# Patient Record
Sex: Male | Born: 1965 | Race: White | Hispanic: No | Marital: Single | Smoking: Current every day smoker
Health system: Southern US, Community
[De-identification: ages and names within clinical notes are randomized; demographics above are authoritative.]

## PROBLEM LIST (undated history)

## (undated) DIAGNOSIS — E7800 Pure hypercholesterolemia, unspecified: Secondary | ICD-10-CM

## (undated) DIAGNOSIS — I2510 Atherosclerotic heart disease of native coronary artery without angina pectoris: Secondary | ICD-10-CM

## (undated) DIAGNOSIS — I10 Essential (primary) hypertension: Secondary | ICD-10-CM

## (undated) DIAGNOSIS — F410 Panic disorder [episodic paroxysmal anxiety] without agoraphobia: Secondary | ICD-10-CM

## (undated) HISTORY — DX: Pure hypercholesterolemia, unspecified: E78.00

## (undated) HISTORY — DX: Atherosclerotic heart disease of native coronary artery without angina pectoris: I25.10

## (undated) HISTORY — DX: Essential (primary) hypertension: I10

## (undated) HISTORY — PX: CARDIAC CATHETERIZATION: SHX172

## (undated) HISTORY — DX: Panic disorder (episodic paroxysmal anxiety): F41.0

---

## 1993-08-24 HISTORY — PX: KNEE ARTHROSCOPY W/ MENISCAL REPAIR: SHX1877

## 1996-08-24 HISTORY — PX: ROTATOR CUFF REPAIR: SHX139

## 1998-04-21 ENCOUNTER — Emergency Department (HOSPITAL_COMMUNITY): Admission: EM | Admit: 1998-04-21 | Discharge: 1998-04-21 | Payer: Self-pay | Admitting: Emergency Medicine

## 1998-11-14 ENCOUNTER — Emergency Department (HOSPITAL_COMMUNITY): Admission: EM | Admit: 1998-11-14 | Discharge: 1998-11-14 | Payer: Self-pay | Admitting: Emergency Medicine

## 1999-07-11 ENCOUNTER — Encounter: Payer: Self-pay | Admitting: Emergency Medicine

## 1999-07-11 ENCOUNTER — Emergency Department (HOSPITAL_COMMUNITY): Admission: EM | Admit: 1999-07-11 | Discharge: 1999-07-11 | Payer: Self-pay | Admitting: Emergency Medicine

## 2000-05-01 ENCOUNTER — Emergency Department (HOSPITAL_COMMUNITY): Admission: EM | Admit: 2000-05-01 | Discharge: 2000-05-01 | Payer: Self-pay | Admitting: Emergency Medicine

## 2000-05-10 ENCOUNTER — Emergency Department (HOSPITAL_COMMUNITY): Admission: EM | Admit: 2000-05-10 | Discharge: 2000-05-10 | Payer: Self-pay | Admitting: Emergency Medicine

## 2000-08-25 ENCOUNTER — Emergency Department (HOSPITAL_COMMUNITY): Admission: EM | Admit: 2000-08-25 | Discharge: 2000-08-25 | Payer: Self-pay | Admitting: Emergency Medicine

## 2000-12-06 ENCOUNTER — Emergency Department (HOSPITAL_COMMUNITY): Admission: EM | Admit: 2000-12-06 | Discharge: 2000-12-06 | Payer: Self-pay | Admitting: Internal Medicine

## 2001-12-05 ENCOUNTER — Emergency Department (HOSPITAL_COMMUNITY): Admission: EM | Admit: 2001-12-05 | Discharge: 2001-12-05 | Payer: Self-pay | Admitting: Emergency Medicine

## 2001-12-06 ENCOUNTER — Emergency Department (HOSPITAL_COMMUNITY): Admission: EM | Admit: 2001-12-06 | Discharge: 2001-12-06 | Payer: Self-pay | Admitting: Emergency Medicine

## 2001-12-21 ENCOUNTER — Emergency Department (HOSPITAL_COMMUNITY): Admission: EM | Admit: 2001-12-21 | Discharge: 2001-12-22 | Payer: Self-pay | Admitting: Emergency Medicine

## 2001-12-23 ENCOUNTER — Emergency Department (HOSPITAL_COMMUNITY): Admission: EM | Admit: 2001-12-23 | Discharge: 2001-12-23 | Payer: Self-pay | Admitting: Emergency Medicine

## 2002-03-21 ENCOUNTER — Emergency Department (HOSPITAL_COMMUNITY): Admission: EM | Admit: 2002-03-21 | Discharge: 2002-03-21 | Payer: Self-pay | Admitting: Emergency Medicine

## 2003-02-20 ENCOUNTER — Emergency Department (HOSPITAL_COMMUNITY): Admission: EM | Admit: 2003-02-20 | Discharge: 2003-02-20 | Payer: Self-pay | Admitting: Emergency Medicine

## 2003-09-06 ENCOUNTER — Emergency Department (HOSPITAL_COMMUNITY): Admission: EM | Admit: 2003-09-06 | Discharge: 2003-09-06 | Payer: Self-pay | Admitting: Emergency Medicine

## 2004-06-30 ENCOUNTER — Emergency Department (HOSPITAL_COMMUNITY): Admission: EM | Admit: 2004-06-30 | Discharge: 2004-06-30 | Payer: Self-pay | Admitting: *Deleted

## 2004-10-28 ENCOUNTER — Ambulatory Visit: Payer: Self-pay | Admitting: Family Medicine

## 2005-07-02 ENCOUNTER — Emergency Department (HOSPITAL_COMMUNITY): Admission: EM | Admit: 2005-07-02 | Discharge: 2005-07-03 | Payer: Self-pay | Admitting: Emergency Medicine

## 2005-07-09 ENCOUNTER — Ambulatory Visit: Payer: Self-pay | Admitting: Family Medicine

## 2005-10-02 ENCOUNTER — Ambulatory Visit: Payer: Self-pay | Admitting: Family Medicine

## 2005-10-29 ENCOUNTER — Ambulatory Visit: Payer: Self-pay | Admitting: Family Medicine

## 2006-05-10 ENCOUNTER — Ambulatory Visit: Payer: Self-pay | Admitting: Family Medicine

## 2006-05-18 ENCOUNTER — Ambulatory Visit: Payer: Self-pay | Admitting: Family Medicine

## 2006-06-02 ENCOUNTER — Ambulatory Visit: Payer: Self-pay | Admitting: Pulmonary Disease

## 2006-07-13 ENCOUNTER — Encounter: Payer: Self-pay | Admitting: Pulmonary Disease

## 2006-07-13 ENCOUNTER — Ambulatory Visit (HOSPITAL_BASED_OUTPATIENT_CLINIC_OR_DEPARTMENT_OTHER): Admission: RE | Admit: 2006-07-13 | Discharge: 2006-07-13 | Payer: Self-pay | Admitting: Pulmonary Disease

## 2006-07-17 ENCOUNTER — Ambulatory Visit: Payer: Self-pay | Admitting: Pulmonary Disease

## 2006-08-03 ENCOUNTER — Ambulatory Visit: Payer: Self-pay | Admitting: Pulmonary Disease

## 2006-09-02 ENCOUNTER — Ambulatory Visit: Payer: Self-pay | Admitting: Pulmonary Disease

## 2006-10-08 ENCOUNTER — Ambulatory Visit: Payer: Self-pay | Admitting: Family Medicine

## 2007-05-04 DIAGNOSIS — F411 Generalized anxiety disorder: Secondary | ICD-10-CM

## 2007-05-04 DIAGNOSIS — J45909 Unspecified asthma, uncomplicated: Secondary | ICD-10-CM | POA: Insufficient documentation

## 2007-12-20 ENCOUNTER — Ambulatory Visit: Payer: Self-pay | Admitting: Family Medicine

## 2007-12-20 DIAGNOSIS — J309 Allergic rhinitis, unspecified: Secondary | ICD-10-CM | POA: Insufficient documentation

## 2007-12-24 ENCOUNTER — Observation Stay (HOSPITAL_COMMUNITY): Admission: EM | Admit: 2007-12-24 | Discharge: 2007-12-26 | Payer: Self-pay | Admitting: Emergency Medicine

## 2007-12-24 ENCOUNTER — Ambulatory Visit: Payer: Self-pay | Admitting: Internal Medicine

## 2007-12-30 ENCOUNTER — Ambulatory Visit: Payer: Self-pay | Admitting: Family Medicine

## 2007-12-30 DIAGNOSIS — N3000 Acute cystitis without hematuria: Secondary | ICD-10-CM

## 2007-12-30 DIAGNOSIS — I2510 Atherosclerotic heart disease of native coronary artery without angina pectoris: Secondary | ICD-10-CM | POA: Insufficient documentation

## 2007-12-30 LAB — CONVERTED CEMR LAB
Bilirubin Urine: NEGATIVE
Glucose, Urine, Semiquant: NEGATIVE
Specific Gravity, Urine: 1.015
pH: 6.5

## 2008-01-02 ENCOUNTER — Telehealth: Payer: Self-pay | Admitting: Family Medicine

## 2008-01-04 ENCOUNTER — Ambulatory Visit: Payer: Self-pay | Admitting: Family Medicine

## 2008-01-06 ENCOUNTER — Telehealth (INDEPENDENT_AMBULATORY_CARE_PROVIDER_SITE_OTHER): Payer: Self-pay

## 2008-01-06 LAB — CONVERTED CEMR LAB
Albumin: 3.8 g/dL (ref 3.5–5.2)
Alkaline Phosphatase: 95 units/L (ref 39–117)
BUN: 16 mg/dL (ref 6–23)
Basophils Relative: 0.2 % (ref 0.0–1.0)
Eosinophils Relative: 2 % (ref 0.0–5.0)
GFR calc Af Amer: 136 mL/min
Glucose, Bld: 120 mg/dL — ABNORMAL HIGH (ref 70–99)
Glucose, Urine, Semiquant: NEGATIVE
HCT: 43.7 % (ref 39.0–52.0)
Hemoglobin: 14.9 g/dL (ref 13.0–17.0)
Monocytes Absolute: 0.6 10*3/uL (ref 0.1–1.0)
Monocytes Relative: 6.9 % (ref 3.0–12.0)
Neutro Abs: 5.5 10*3/uL (ref 1.4–7.7)
Nitrite: NEGATIVE
Potassium: 3.7 meq/L (ref 3.5–5.1)
RBC: 4.88 M/uL (ref 4.22–5.81)
Specific Gravity, Urine: 1.015
Total CHOL/HDL Ratio: 4.4
Total Protein: 7 g/dL (ref 6.0–8.3)
WBC Urine, dipstick: NEGATIVE
WBC: 8.5 10*3/uL (ref 4.5–10.5)

## 2008-01-09 ENCOUNTER — Ambulatory Visit: Payer: Self-pay | Admitting: Family Medicine

## 2008-01-09 DIAGNOSIS — Z87448 Personal history of other diseases of urinary system: Secondary | ICD-10-CM

## 2008-01-09 LAB — CONVERTED CEMR LAB
Bilirubin Urine: NEGATIVE
Nitrite: NEGATIVE
Urobilinogen, UA: 0.2

## 2008-01-12 ENCOUNTER — Telehealth: Payer: Self-pay | Admitting: Family Medicine

## 2008-01-13 ENCOUNTER — Ambulatory Visit: Payer: Self-pay | Admitting: Family Medicine

## 2008-01-13 DIAGNOSIS — N200 Calculus of kidney: Secondary | ICD-10-CM | POA: Insufficient documentation

## 2008-01-13 LAB — CONVERTED CEMR LAB
Glucose, Urine, Semiquant: NEGATIVE
Nitrite: NEGATIVE
Protein, U semiquant: NEGATIVE
Urobilinogen, UA: 0.2
WBC Urine, dipstick: NEGATIVE

## 2008-02-08 ENCOUNTER — Ambulatory Visit: Payer: Self-pay | Admitting: Family Medicine

## 2008-02-08 ENCOUNTER — Ambulatory Visit: Payer: Self-pay | Admitting: Pulmonary Disease

## 2008-02-08 DIAGNOSIS — G4733 Obstructive sleep apnea (adult) (pediatric): Secondary | ICD-10-CM

## 2008-02-08 DIAGNOSIS — R002 Palpitations: Secondary | ICD-10-CM

## 2008-03-05 ENCOUNTER — Ambulatory Visit: Payer: Self-pay | Admitting: Family Medicine

## 2008-03-05 DIAGNOSIS — J019 Acute sinusitis, unspecified: Secondary | ICD-10-CM | POA: Insufficient documentation

## 2008-05-02 ENCOUNTER — Telehealth: Payer: Self-pay | Admitting: Family Medicine

## 2008-05-07 ENCOUNTER — Ambulatory Visit: Payer: Self-pay | Admitting: Family Medicine

## 2008-05-07 DIAGNOSIS — Z87442 Personal history of urinary calculi: Secondary | ICD-10-CM

## 2008-06-12 ENCOUNTER — Ambulatory Visit: Payer: Self-pay | Admitting: Family Medicine

## 2008-06-12 DIAGNOSIS — R040 Epistaxis: Secondary | ICD-10-CM

## 2008-06-12 DIAGNOSIS — R51 Headache: Secondary | ICD-10-CM

## 2008-07-24 ENCOUNTER — Ambulatory Visit: Payer: Self-pay | Admitting: Family Medicine

## 2008-07-25 ENCOUNTER — Telehealth (INDEPENDENT_AMBULATORY_CARE_PROVIDER_SITE_OTHER): Payer: Self-pay | Admitting: *Deleted

## 2008-07-26 ENCOUNTER — Ambulatory Visit: Payer: Self-pay | Admitting: Pulmonary Disease

## 2008-07-26 ENCOUNTER — Ambulatory Visit: Payer: Self-pay

## 2008-07-26 ENCOUNTER — Ambulatory Visit: Payer: Self-pay | Admitting: Cardiovascular Disease

## 2008-09-26 ENCOUNTER — Ambulatory Visit: Payer: Self-pay | Admitting: Family Medicine

## 2008-10-03 ENCOUNTER — Telehealth: Payer: Self-pay | Admitting: Family Medicine

## 2008-10-12 ENCOUNTER — Ambulatory Visit: Payer: Self-pay | Admitting: Pulmonary Disease

## 2008-11-17 ENCOUNTER — Emergency Department (HOSPITAL_COMMUNITY): Admission: EM | Admit: 2008-11-17 | Discharge: 2008-11-17 | Payer: Self-pay | Admitting: Emergency Medicine

## 2009-01-22 ENCOUNTER — Telehealth: Payer: Self-pay | Admitting: Family Medicine

## 2009-01-29 ENCOUNTER — Encounter: Payer: Self-pay | Admitting: Family Medicine

## 2009-03-18 ENCOUNTER — Ambulatory Visit: Payer: Self-pay | Admitting: Family Medicine

## 2009-03-18 DIAGNOSIS — T675XXA Heat exhaustion, unspecified, initial encounter: Secondary | ICD-10-CM | POA: Insufficient documentation

## 2009-05-01 ENCOUNTER — Ambulatory Visit: Payer: Self-pay | Admitting: Family Medicine

## 2009-05-01 ENCOUNTER — Ambulatory Visit: Payer: Self-pay | Admitting: Cardiology

## 2009-05-01 LAB — CONVERTED CEMR LAB
Nitrite: NEGATIVE
Urobilinogen, UA: 0.2
WBC Urine, dipstick: NEGATIVE

## 2009-05-07 ENCOUNTER — Ambulatory Visit: Payer: Self-pay | Admitting: Family Medicine

## 2009-05-07 LAB — CONVERTED CEMR LAB
Ketones, urine, test strip: NEGATIVE
Nitrite: NEGATIVE
Urobilinogen, UA: 0.2

## 2009-07-03 ENCOUNTER — Encounter (INDEPENDENT_AMBULATORY_CARE_PROVIDER_SITE_OTHER): Payer: Self-pay | Admitting: *Deleted

## 2010-05-06 ENCOUNTER — Emergency Department (HOSPITAL_COMMUNITY)
Admission: EM | Admit: 2010-05-06 | Discharge: 2010-05-06 | Payer: Self-pay | Source: Home / Self Care | Admitting: Emergency Medicine

## 2010-05-08 ENCOUNTER — Telehealth: Payer: Self-pay | Admitting: Family Medicine

## 2010-09-23 NOTE — Progress Notes (Signed)
Summary: friday late ov  Phone Note Call from Patient Call back at [phone]   Caller: Patient Call For: Nelwyn Salisbury MD Summary of Call: pt had abcess remove from thigh at [facility] needs fu this friday late can I sch? Initial call taken by: Heron Sabins,  May 08, 2010 10:30 AM  Follow-up for Phone Call        Pt called back to check on status of getting late ov sch for this friday. Pls call asap.  Follow-up by: Lucy Antigua,  May 08, 2010 11:30 AM  Additional Follow-up for Phone Call Additional follow up Details #1::        yes go ahead and schedule this  Additional Follow-up by: Nelwyn Salisbury MD,  May 08, 2010 12:21 PM    Additional Follow-up for Phone Call Additional follow up Details #2::    pt stated he will callback Follow-up by: Heron Sabins,  May 08, 2010 1:06 PM  Additional Follow-up for Phone Call Additional follow up Details #3:: Details for Additional Follow-up Action Taken: as of 05-20-2010 pt did not cb Additional Follow-up by: Heron Sabins,  May 20, 2010 3:02 PM

## 2011-01-06 NOTE — Discharge Summary (Signed)
Dylan Houston, MURNANE NO.:  [id number]   MEDICAL RECORD NO.:  [id number]          PATIENT TYPE:  INP   LOCATION:  3738                         FACILITY:  [facility]   PHYSICIAN:  Veverly Fells. Excell Seltzer, MD  DATE OF BIRTH:  1965/11/30   DATE OF ADMISSION:  12/24/2007  DATE OF DISCHARGE:  12/26/2007                               DISCHARGE SUMMARY   PRIMARY CARE Monque Haggar:  Jeannett Senior A. Clent Ridges, MD.   PRIMARY PULMONOLOGIST:  Barbaraann Share, MD, FCCP.   DISCHARGE DIAGNOSIS:  Chest pain.   SECONDARY DIAGNOSES:  1. 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  2. Ongoing tobacco abuse.  3. Asthma.  4. Allergic rhinitis.  5. Nephrolithiasis.   ALLERGIES:  He is intolerant to Benadryl.   PROCEDURES:  Left side cardiac catheterization.   HISTORY OF PRESENT ILLNESS:  A 45 year old Caucasian male with several  month history of left-sided chest discomfort, described as a punching  sensation, occurring intermittently.  On the day of admission, he was at  work and when he started to smoke a cigarette, he started to feel dizzy  with chest pressure and when he walked back inside, he apparently did  not look well, and the EMS was activated by coworkers.  Upon arrival at  [facility] Emergency Department, cardiac point of care markers were  negative x3, and an ECG showed sinus tachycardia with no acute ST-T wave  changes.  Decision was made to admit the patient for further evaluation  and cardiac catheterization.   HOSPITAL COURSE:  The patient ruled out for MI and was scheduled for  cardiac catheterization.  Catheterization performed today showed  nonobstructive coronary artery disease with normal LV function.  Postprocedure, the patient has been ambulating without recurrent  discomfort and will be discharged home today in good condition.   DISCHARGE LABORATORY:  Hemoglobin 14.2, hematocrit 41.5, WBC 9.1,  platelets 23, and MCV 88.0.   Sodium 140, potassium 4.0, chloride 104,  CO2 26, BUN 13, creatinine 29, glucose 101, total bilirubin 0.5,  alkaline phosphatase 97, AST 22, ALT 23, and albumin 3.5.  Cardiac  markers negative x3.  Total cholesterol 158, triglycerides 209, HDL 29,  LDL 87, calcium 8.8, hemoglobin A1c 5.2, and TSH 1.359.   DISPOSITION:  The patient is being discharged home today in good  condition.   FOLLOWUP APPOINTMENTS:  He was asked to follow up with Dr. Clent Ridges in 2-3  weeks.   DISCHARGE MEDICATIONS:  1. Aspirin 81 mg daily.  2. Simvastatin 40 mg nightly.  3. Klonopin 2 mg t.i.d. p.r.n.   OUTSTANDING LAB STUDIES:  None.   The patient has been counseled the importance of smoking cessation.   DURATION OF DISCHARGE/ENCOUNTER:  Forty minutes including physician  time.      Nicolasa Ducking, ANP      Veverly Fells. Excell Seltzer, MD  Electronically Signed    CB/MEDQ  D:  12/26/2007  T:  12/27/2007  Job:  [id number]   cc:   Tera Mater. Clent Ridges, MD

## 2011-01-06 NOTE — Assessment & Plan Note (Signed)
[facility]                            CARDIOLOGY OFFICE NOTE   NAME:Dylan Houston, Dylan Houston                       MRN:          [id number]  DATE:07/26/2008                            DOB:          January 24, 1966    Dylan Houston is a 42-year patient referred by Dr. Clent Ridges.  I have seen him in  the past.  The patient was referred for palpitations and tachycardia.   Unfortunately, Dylan Houston is a very heavy smoker.  He admits to at least 1  pack a day; however, he frequently wakes up in the middle of the night  to have a cigarette.   He has had previous chest pain and heart catheterization by Dr. Excell Seltzer  back on Dec 26, 2007.  He had no critical coronary disease, normal  filling pressures, known EF of 60%.   The patient indicates that he has always had a relatively high heart  rate.  Looking back through our records, he had a pulse of 90 back in  November 2008 and pulse of 110 back in December 2007.   He has significant sleep apnea.  He has lost little bit of weight lately  and his CPAP mask is not working fine.  He is to see Dr. Shelle Iron today.   The patient otherwise does not have a lot of stimulants.   His degree of COPD has not been characterized.   I suspect that his relative tachycardia is related to nighttime  hypoxemia, central obesity, nicotine, and high catecholamine anxiety-  type state.   The patient does not actively wheeze.  He does not have documented  bronchospasm by PFTs.   In regards to his heart, he has not had any significant chest pain.  His  palpitations have been fairly frequent.  He does indicate that he can  have sudden onset of palpitations that last a few minutes and then seem  to come back down.  There is no associated presyncope or diaphoresis.  There is associated shortness of breath which he can also get without  palpitations.   His review of systems is otherwise negative.   He has past medical history remarkable for anxiety, mild  coronary  disease by cath in May 2009, allergic rhinitis, nephrolithiasis.  He has  had previous left knee surgery and rotator cuff tear.   The patient is divorced.  He has 2 children.  He works doing Airline pilot for  [employer].  He smokes at least a pack a day and denies alcohol intake or  other drugs.   Family history is remarkable for premature coronary disease on both  mother's and father's side.  His father is dying of lung cancer.   His current medications only include an occasional aspirin.  He  previously had been on simvastatin, I am not sure why this was stopped.  He does have hypercholesterolemia.   PHYSICAL EXAMINATION:  GENERAL:  Remarkable for a disheveled male who  looks older than his stated age.  He has multiple tattoos over his body.  VITAL SIGNS:  Weight is 193, blood pressure is 149/79, pulse is  105 and  regular, respiratory rate 14 and afebrile.  HEENT:  Unremarkable.  Carotids normal without bruit.  No  lymphadenopathy, thyromegaly, JVP elevation.  LUNGS:  Clear with no active wheezing.  S1 and S2 with normal heart  sounds.  PMI normal.  ABDOMEN:  Benign.  Bowel sounds positive.  No AAA.  No bruit.  No  hepatosplenomegaly or hepatojugular reflux.  No tenderness.  EXTREMITIES:  Distal pulses are intact.  No edema.  NEUROLOGIC:  Nonfocal.  SKIN:  Warm and dry.  MUSCULOSKELETAL:  No muscular weakness.   EKG is normal except for tachycardia.  P-wave morphology is normal,  upright in II, III, and F.   IMPRESSION:  1. Relative tachycardia.  I suspect this is benign.  He has had this      for quite some time.  We have documentation of it as far back as      2007.  He has had normal cath with good left ventricular function      and normal filling pressures.  He had a TSH checked in May which      was normal for the time being; however, we will start him on Toprol      50 a day.  We will give him an event monitor to see if there is any      other supraventricular rhythm  is going on.  I will then see him      back to reassess the situation.  2. History of sleep apnea with poorly fitting continuous positive      airway pressure mask, question nocturnal hypoxemia contributing to      tachycardia.  Follow up with Dr. Shelle Iron today.  3. Hypercholesterolemia.  Script for simvastatin 40 mg a day redone      through Dr. Clinton Gallant.  4. Smoking cessation.  Spent greater than 10 minutes talking with the      patient.  I think he would be a reasonable candidate for Chantix.      I encouraged him to follow up with Dr. Clent Ridges to get this      prescription.  We are starting him back on simvastatin and Toprol,      and I think it would be better for him to possibly start his      Chantix in about a month.  I also encouraged him to start taking a      baby aspirin a day.  He has had some minor nosebleeds on adult      aspirin.  I think he will do fine with a baby aspirin.  5. History of reflux, p.r.n. Nexium.   I will see him back in 4-6 weeks to further assess his heart rate.  At  some point in time, it may be worthwhile to get a 24-hour urine  collection on him to check his catecholamine levels as well.    Noralyn Pick. Eden Emms, MD, [facility]  Electronically Signed   PCN/MedQ  DD: 07/26/2008  DT: 07/27/2008  Job #: [id number]   cc:   Jeannett Senior A. Clent Ridges, MD

## 2011-01-06 NOTE — Cardiovascular Report (Signed)
NAMEHILMAN, KISSLING NO.:  [id number]   MEDICAL RECORD NO.:  [id number]          PATIENT TYPE:  INP   LOCATION:  3738                         FACILITY:  [facility]   PHYSICIAN:  Veverly Fells. Excell Seltzer, MD  DATE OF BIRTH:  [birth year]   DATE OF PROCEDURE:  DATE OF DISCHARGE:                            CARDIAC CATHETERIZATION   PROCEDURES:  1. Left heart catheterization.  2. Selective coronary angiography.  3. Left ventricular angiography, and StarClose of the right femoral      artery.   INDICATIONS:  Mr. Mccreedy is a 45 year old gentleman who presented with  chest pain.  He had typical exertional symptoms with a high pretest  probability for CAD in the setting of multiple cardiac risk factors.  He  was referred directly for cardiac cath.   Risks and indications of procedure were reviewed with the patient.  Informed consent was obtained.  The right groin was prepped and draped  and anesthetized with 1% lidocaine.  Using modified Seldinger technique,  a 6-French sheath was placed in the right femoral artery.  A standard 6-  [place] Judkins catheters were used for coronary angiography and left  ventriculography.  A pullback across the aortic valve was done.  At the  completion of the procedure, a StarClose device was used for hemostasis.   FINDINGS:  Aortic pressure 98/71 with a mean of 84, left ventricular  pressure 99/15.   Coronary angiography.  Left Mainstem:  The left main is widely patent.  There are no significant stenoses.  It bifurcates into the LAD and left  circumflex.   LAD:  The LAD is moderate-sized vessel proximally.  There is a 30%  stenosis just before the first septal perforator.  The vessel supplies a  large first diagonal branch that essentially runs in the course of a  twin LAD.  The diagonal branch is actually a larger caliber vessel than  the LAD proper.  The diagonal has minimal plaque without significant  stenosis.  The LAD has mild-to-moderate  diffuse stenosis of no worse  than 40%.   The left circumflex is widely patent.  There is mild plaque with 30%  stenosis.  In the proximal circumflex, there is a large first OM branch.  The AV groove courses down and supplies two small posterolateral  branches.   Right coronary artery:  The right coronary artery is widely patent.  There are no significant stenoses throughout.  The right coronary artery  divides into a PDA and posterolateral branch distally, both of which are  large vessels.   Left ventriculography is normal.  The LVEF is 60%.   ASSESSMENT:  1. Nonobstructive coronary artery disease.  2. Normal left ventricular function.   PLAN:  I recommend medical therapy with aspirin and a statin for goal of  LDL less than 100.  Tobacco cessation was strongly advised.      Veverly Fells. Excell Seltzer, MD  Electronically Signed     MDC/MEDQ  D:  12/26/2007  T:  12/27/2007  Job:  [id number]   cc:   Tera Mater. Clent Ridges, MD

## 2011-01-06 NOTE — Assessment & Plan Note (Signed)
[facility]                                 ON-CALL NOTE   NAME:FARMERDamani, Kelemen                         MRN:          [id number]  DATE:01/01/2008                            DOB:          [birth year]    DATE OF INTERACTION:  Jan 01, 2008, at 10:22 a.m.  Phone number is [phone].   OBJECTIVE:  The patient  is being treated for a bacterial infection  either of the stomach or urine; I could not tell which. Has had cramps  and blood in the urine.  He is on Cipro since earlier in the week.  Yesterday his bleeding in his urine cleared, and now he is having blood  in the urine with cramping and is concerned about it.   OBJECTIVE:  Presumably bladder bleeding, possibly urinary tract  infection, possibly urethritis. stomach cramps.   PLAN:  I told him that Azo, an over-the-counter medicine, might help the  cramping. The bleeding is not terribly concerning at this stage as long  as it clears again, and told him to drink liquids as much as possible  today. Just in case the cramping is in the stomach, would have him take  clear liquids today to give the bowels some rest, and, if symptoms  worsen, go to the emergency room or to acute care. Also, I think the  patient ought to be seen soon and told him to call in the morning to get  an appointment to see Dr. Clent Ridges for a recheck.  Primary care Daulton Harbaugh is  Dr. Clent Ridges, home office is [place].     Arta Silence, MD  Electronically Signed    RNS/MedQ  DD: 01/01/2008  DT: 01/01/2008  Job #: [id number]

## 2011-01-06 NOTE — H&P (Signed)
[name], FILES NO.:  [id number]   MEDICAL RECORD NO.:  [id number]          PATIENT TYPE:  INP   LOCATION:  3738                         FACILITY:  [facility]   PHYSICIAN:  Joellyn Rued, PA-C     DATE OF BIRTH:  [birth year]   DATE OF ADMISSION:  12/24/2007  DATE OF DISCHARGE:                              HISTORY & PHYSICAL   PRIMARY CARE PHYSICIAN:  Jeannett Senior A. Clent Ridges, MD.   ADMITTING PHYSICIAN:  Bevelyn Buckles. Bensimhon, MD.   PULMONOLOGIST:  Barbaraann Share, MD,FCCP.   HISTORY:  Dylan Houston is a 45 year old white male who is transferred via  EMS to [facility] for evaluation of chest discomfort.  Mr.  Erbes describes a several-month history of left-sided anterior chest  kind of punching sensation.  This is usually associated with increased  heart rate, but he never checked his pulse.  He also noticed shortness  of breath and dizziness, but denies any actual syncope.  He rarely has  nausea and occasional diaphoresis.  It usually last approximately 15  minutes, it is difficult to pin point frequency, it depends on his  activity.  He states it usually occurs with lifting not specifically  with exertion or nocturnally.   Today, while at work, he went outside to smoke a cigarette and then  decided not to.  He stated that he felt very dizzy and felt a pressure  on the left side of his chest.  When he walked back inside to work, his  supervisor and co-workers looked at him, and sat his down, stating that  he looked off, and they he called 911.  EMS report is not available.  Here in the emergency room at this time, he is pain free.   He also has hypokalemia.   PAST MEDICAL HISTORY:   ALLERGIES:  No known drug allergies.  However, he states he has BENADRYL  INTOLERANCE.   He takes Klonopin 2 mg up to 3 times a day.   He has history of severe obstructive sleep apnea by nocturnal  polysomnogram on 07/13/2006.  He states he has compliant for CPAP.  He  has  history of asthma, allergic rhinitis, and kidney stones.  He states  that he has been told by Dr. Clent Ridges that his resting heart rate is fast,  and he has been told in the past that he is hard to wake up from  surgery.  He admits to left shoulder and left knee surgery.  He  specifically denies diabetes, hypertension, hyperlipidemia, mycoardial  infarction, CVA, COPD, or bleeding dyscrasias.   SOCIAL HISTORY:  He resides in [place] with his wife.  He has 3  children and 1 grandchild.  He is a Occupational psychologist.  He  smokes up to 1 pack per day for 30+ years.  Denies any alcohol, drugs,  or herbal medications.  Does not watch his diet.  Does not exercise.   FAMILY HISTORY:  His mother died of cancer.  His father is alive at age  23, had bypass surgery in his 76, and he is now  being treated for lung  cancer.  He has 2 brothers, age 81 and 60, who are alive and well.   REVIEW OF SYSTEMS:  In addition to the above, he is notable for  headache, sinus congestion, glasses, poor dentition, multiple tattoos,  chronic dyspnea on exertion, nocturia, occasional anxiety, arthralgias  in his back, neck, shoulders, knees, elbows and rest all other points  are unremarkable.  He denies syncope, claudication, or wheezing.   PHYSICAL EXAMINATION:  GENERAL:  Well-nourished, well-developed white  male who appears older than stated age.  Wife is present.  VITAL SIGNS:  Temperature 97.8, initial blood pressure is 116/62, pulse  106, respirations 18, and 98% sat on room air.  HEENT:  Unremarkable except for poor dentition.  NECK:  Supple without thyromegaly, adenopathy, JVD, or carotid bruits.  CHEST:  Symmetrical excursion.  Breath sounds are diminished but clear  to auscultation.  HEART:  PMI is nondisplaced.  Regular rate and rhythm.  I do not  appreciate any S3 and S4 murmurs, rubs, clicks, or gallops.  All pulses  are symmetrical and intact without any abdominal or femoral bruits.   SKIN/INTEGUMENT:  Intact.  He has multiple tattoos.  ABDOMEN:  Obese.  Bowel sounds present without organomegaly, masses, or  tenderness.  EXTREMITIES:  No cyanosis, clubbing, or edema.  MUSCULOSKELETAL:  Unremarkable.  NEURO:  Unremarkable.   Chest x-ray showed no active disease.  EKG showed sinus tachycardia with  a rate of 108, normal axis, nonspecific changes, normal intervals.  No  old EKG for comparison.  H and H was 14.6 and 41.3, normal indices,  platelets 230, and WBC is 9.1.  Sodium 139, potassium 3.2, BUN 8,  creatinine 0.83, and glucose 120.  Point-of-care markers negative x3.   IMPRESSION AND PLAN:  Dr. Gala Romney reviewed the patients' history,  spoke with and examined the patient, and agrees with the above.  Dr.  Gala Romney feels these symptoms are classic for unstable angina.  We will  admit him.  Begin heparin, beta blocker, aspirin, statin, and nitrates.  Anticipate cardiac catheterization on Monday for further evaluation.  We  have already counseled the patient on tobacco cessation and weight loss.      Joellyn Rued, PA-C     EW/MEDQ  D:  12/24/2007  T:  12/24/2007  Job:  [id number]   cc:   Barbaraann Share, MD,FCCP

## 2011-01-09 NOTE — Procedures (Signed)
NAME:  Dylan Houston, Dylan Houston NO.:  [id number]   MEDICAL RECORD NO.:  [id number]          PATIENT TYPE:  OUT   LOCATION:  SLEEP CENTER                 [facility]  [facility]   PHYSICIAN:  Barbaraann Share, MD,FCCPDATE OF BIRTH:  Dec 28[birth year] 1965   DATE OF STUDY:  07/13/2006                            NOCTURNAL POLYSOMNOGRAM   REFERRING PHYSICIAN:  Dr. Marcelyn Bruins   INDICATION FOR STUDY:  Hypersomnia with sleep apnea.   EPWORTH SCORE:  11.   SLEEP ARCHITECTURE:  The patient had total sleep time 342 minutes with  decreased REM and never achieved slow wave sleep.  Sleep onset latency  was prolonged at 40 minutes.  REM onset was very prolonged at 237  minutes.  Sleep efficiency was decreased at 81%.   RESPIRATORY DATA:  The patient was found to have 32 hypotonias and 390  obstructive apneas for a respiratory disturbance index of 74 events per  hour.  The events were not positional but there was severe snoring noted  throughout.   OXYGEN DATA:  His oxygen saturation was as low as 60% during the  patient's obstructive events.   CARDIAC DATA:  No clinically significant cardiac arrhythmia.   MOVEMENT/PARASOMNIA:  None.   IMPRESSION/RECOMMENDATIONS:  Severe obstructive sleep apnea/hypotonia  syndrome with a respiratory disturbance index of 74 events per hour and  oxygen desaturation as low as 60%.  Treatment for this degree of sleep  apnea should focus primarily on weight loss if applicable as well as  CPAP.      Barbaraann Share, MD,FCCP  Diplomate, American Board of Sleep  Medicine     [facility]/MEDQ  D:  07/21/2006 10:46:27  T:  07/21/2006 14:33:57  Job:  [id number]

## 2011-01-09 NOTE — Assessment & Plan Note (Signed)
[place] [facility]                               PULMONARY OFFICE NOTE   NAME:Dylan Houston, Dylan Houston                       MRN:          [id number]  DATE:06/02/2006                            DOB:          07/30/1966    HISTORY OF PRESENT ILLNESS:  The patient is a 45 year old white male whom I  have been asked to see for possible sleep apnea.  The patient states that he  has had heavy snoring, as well as pauses in his breathing during sleep for  at least the last 7 to 8 years.  He typically goes to bed between 10:00 and  12:00 and gets up at 6:30 to 7:00 to start his day.  He has been noted to  have frequent awakenings during the night and does not feel rested the  following morning.  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  He has  no difficulties with driving.  Of note, his weight is up about 5 or 6 pounds  over the last 2 years.   PAST MEDICAL HISTORY:  1. Significant for asthma.  2. History of allergic rhinitis.  3. History of prior shoulder and knee surgery.   CURRENT MEDICATIONS:  1. Zegerid 40 mg nightly.  2. Klonopin 0.5 mg p.r.n.  3. Vicodin 5/500 p.r.n.   The patient is intolerant to BENADRYL.   SOCIAL HISTORY:  He is separated and has children.  He has a history of  smoking 1 to 1-1/2 of cigarettes per day for the last 26 years.  He  continues to smoke.   FAMILY HISTORY:  Remarkable for his father having emphysema and lung cancer.  His mother had breast cancer.   REVIEW OF SYSTEMS:  As per history of present illness.  Also see patient  intake form documented in the chart.   PHYSICAL EXAM:  GENERAL:  He is an overweight male in no acute distress.  Blood pressure 114/76, pulse 116, temperature 98.5, weight is 208 pounds.  O2 saturation on room air is 96%.  HEENT:  Pupils are equal, round, and reactive to light and accommodation.  Extraocular muscles are intact.   Nares are somewhat narrowed bilaterally.  Oropharynx does show significant elongation of the soft palate and uvula.  The patient has a small lower jaw with overbite.  NECK:  Supple without JVD or lymphadenopathy.  No palpable thyromegaly.  CHEST:  Totally clear, except for a mild decrease in breath sounds.  CARDIAC:  Regular rate and rhythm.  ABDOMEN:  Soft and nontender with good bowel sounds.  GENITAL, RECTAL, AND BREASTS:  Not done and not indicated.  LOWER EXTREMITIES:  Without edema.  Good pulses distally.  No calf  tenderness.  NEUROLOGIC:  He is alert and oriented with no obvious motor deficits.   IMPRESSION:  Probable obstructive sleep apnea.  The patient gives a classic  history of this.  He is overweight and has very abnormal upper airway  anatomy.  I had a long discussion with him  about sleep apnea including the  short-term quality of life issues and the long-term cardiovascular issues.  At this point in time he will need nocturnal polysomnography for diagnosis.   PLAN:  1. Schedule for nocturnal polysomnogram.  2. Work on weight loss.  3. The patient will follow up after the above.            ______________________________  Barbaraann Share, MD,FCCP      [facility]/MedQ  DD:  06/14/2006  DT:  06/14/2006  Job #:  [id number]   cc:   Jeannett Senior A. Clent Ridges, MD

## 2012-05-18 ENCOUNTER — Telehealth: Payer: Self-pay | Admitting: Family Medicine

## 2012-05-18 NOTE — Telephone Encounter (Signed)
Schedule pt for a 30 minute office visit and advise him to come fasting for lab work.

## 2012-05-18 NOTE — Telephone Encounter (Signed)
Pt req to re-est. Last seen 05/07/2009. Pt has head congestion. Is ok to re-est or ok to just due acute ov?

## 2012-05-19 NOTE — Telephone Encounter (Signed)
Pt called and has been sched for 30 min office on 05/26/12 at 10am fasting,as noted.

## 2012-05-26 ENCOUNTER — Ambulatory Visit: Payer: Self-pay | Admitting: Family Medicine

## 2012-06-30 ENCOUNTER — Ambulatory Visit (INDEPENDENT_AMBULATORY_CARE_PROVIDER_SITE_OTHER): Payer: Self-pay | Admitting: Family Medicine

## 2012-06-30 ENCOUNTER — Encounter: Payer: Self-pay | Admitting: Family Medicine

## 2012-06-30 VITALS — BP 112/70 | HR 111 | Temp 98.3°F | Wt 214.0 lb

## 2012-06-30 DIAGNOSIS — I2510 Atherosclerotic heart disease of native coronary artery without angina pectoris: Secondary | ICD-10-CM

## 2012-06-30 DIAGNOSIS — J45909 Unspecified asthma, uncomplicated: Secondary | ICD-10-CM

## 2012-06-30 DIAGNOSIS — R002 Palpitations: Secondary | ICD-10-CM

## 2012-06-30 DIAGNOSIS — Z23 Encounter for immunization: Secondary | ICD-10-CM

## 2012-06-30 DIAGNOSIS — F411 Generalized anxiety disorder: Secondary | ICD-10-CM

## 2012-06-30 MED ORDER — KETOCONAZOLE 2 % EX CREA: TOPICAL_CREAM | Freq: Three times a day (TID) | CUTANEOUS | Status: DC

## 2012-06-30 MED ORDER — PROPRANOLOL HCL 10 MG PO TABS: 10.0000 mg | ORAL_TABLET | Freq: Four times a day (QID) | ORAL | Status: DC | PRN

## 2012-06-30 MED ORDER — CLONAZEPAM 0.5 MG PO TABS: 0.5000 mg | ORAL_TABLET | Freq: Two times a day (BID) | ORAL | Status: DC | PRN

## 2012-06-30 MED ORDER — ATENOLOL 25 MG PO TABS: 25.0000 mg | ORAL_TABLET | Freq: Every day | ORAL | Status: DC

## 2012-06-30 NOTE — Addendum Note (Signed)
Addended by: Aniceto Boss A on: 06/30/2012 12:51 PM   Modules accepted: Orders

## 2012-06-30 NOTE — Progress Notes (Signed)
  Subjective:    Patient ID: Dylan Houston, male    DOB: 01/15/1966, 46 y.o.   MRN: [id number]  HPI 46 yr old male to re-establish after an absence of 3 years. He lost his job shortly after I saw him in September 2010, and he has been out of work ever since. Of course this means he has had no insurance either, so he has not seen any physicians until today. He had been on Tenormin and Propranolol for his rapid heart rates, and they had been working well. Now that he is out, he often experiences spells of rapid heart beats or fluttering which are not related to exertion. No chest pain or SOB. He also asks about an itchy rash in both groin areas that comes and goes for the past 2 years.   Review of Systems  Constitutional: Negative.   Respiratory: Negative.   Cardiovascular: Positive for palpitations. Negative for chest pain and leg swelling.  Neurological: Negative.        Objective:   Physical Exam  Constitutional: He is oriented to person, place, and time. He appears well-developed and well-nourished.  Neck: No thyromegaly present.  Cardiovascular: Normal rate, regular rhythm, normal heart sounds and intact distal pulses.   Pulmonary/Chest: Effort normal and breath sounds normal. No respiratory distress. He has no wheezes. He has no rales.  Lymphadenopathy:    He has no cervical adenopathy.  Neurological: He is alert and oriented to person, place, and time.  Psychiatric: He has a normal mood and affect. His behavior is normal. Thought content normal.          Assessment & Plan:  He seems to be stable and not much changed from the last time we saw him. Refilled his meds to take Atenolol daily and use Propranolol as needed. Try Ketoconazole for the Candidiasis.

## 2012-10-28 ENCOUNTER — Ambulatory Visit: Payer: Self-pay | Admitting: Family Medicine

## 2012-11-16 ENCOUNTER — Ambulatory Visit (HOSPITAL_BASED_OUTPATIENT_CLINIC_OR_DEPARTMENT_OTHER): Payer: Self-pay | Attending: Pulmonary Disease | Admitting: Radiology

## 2012-11-16 ENCOUNTER — Encounter: Payer: Self-pay | Admitting: Pulmonary Disease

## 2012-11-16 ENCOUNTER — Ambulatory Visit (INDEPENDENT_AMBULATORY_CARE_PROVIDER_SITE_OTHER): Payer: Self-pay | Admitting: Pulmonary Disease

## 2012-11-16 VITALS — BP 118/68 | HR 85 | Temp 97.6°F | Ht 64.0 in | Wt 216.8 lb

## 2012-11-16 DIAGNOSIS — G4733 Obstructive sleep apnea (adult) (pediatric): Secondary | ICD-10-CM

## 2012-11-16 DIAGNOSIS — Z9989 Dependence on other enabling machines and devices: Secondary | ICD-10-CM

## 2012-11-16 NOTE — Patient Instructions (Addendum)
Stay on cpap Will refer you to the sleep center for a mask fitting. Will give you a prescription for a heated humidifier, and can check online to see if cheaper ([facility] or [facility]) Work on weight loss followup with me in one year if doing well.

## 2012-11-16 NOTE — Assessment & Plan Note (Signed)
The patient is having issues with his CPAP because of a nonfunctioning mask.  He has no insurance, and is unable to afford a new mask.  Will refer him to the sleep center for a mask fitting, and perhaps they will be able to give him one.  I have also given him a prescription for a heated humidifier so he can try and purchase on line where it may be cheaper.  I have also encouraged him to work on weight loss

## 2012-11-16 NOTE — Progress Notes (Signed)
  Subjective:    Patient ID: Dylan Houston, male    DOB: Sep 26, 1965, 47 y.o.   MRN: [id number]  HPI The patient comes in today for followup of his obstructive sleep apnea.  He has not been seen in 4 years, but has stayed compliant on his CPAP.  His current mask is falling apart, and he is trying to hold it together with glue and tape.  He has also lost his heating humidifier.  He was doing well with CPAP prior to having mask issues, and now he is having frequent awakenings again with daytime sleepiness.  He has a history of very severe sleep apnea.  Of note, his weight is up 6 pounds since last visit.   Review of Systems  Constitutional: Negative for fever and unexpected weight change.  HENT: Positive for congestion, rhinorrhea, sneezing and postnasal drip. Negative for ear pain, nosebleeds, sore throat, trouble swallowing, dental problem and sinus pressure.   Eyes: Negative for redness and itching.  Respiratory: Positive for shortness of breath. Negative for cough, chest tightness and wheezing.   Cardiovascular: Positive for palpitations. Negative for leg swelling.  Gastrointestinal: Negative for nausea and vomiting.  Genitourinary: Negative for dysuria.  Musculoskeletal: Negative for joint swelling.  Skin: Negative for rash.  Neurological: Negative for headaches.  Hematological: Does not bruise/bleed easily.  Psychiatric/Behavioral: Negative for dysphoric mood. The patient is not nervous/anxious.        Objective:   Physical Exam Obese male in no acute distress No skin breakdown or pressure necrosis from the CPAP mask Nose with swollen turbinates Oropharynx with thickening and elongation soft palate and uvula Chest totally clear to auscultation Cardiac exam with regular rate and rhythm Lower extremities without edema, no cyanosis Awake but appears to be sleepy, moves all 4 extremities.       Assessment & Plan:

## 2013-04-26 ENCOUNTER — Telehealth: Payer: Self-pay | Admitting: Family Medicine

## 2013-04-26 ENCOUNTER — Other Ambulatory Visit: Payer: Self-pay | Admitting: Family Medicine

## 2013-04-26 NOTE — Telephone Encounter (Signed)
Call in #[phone] with 5 rf 

## 2013-04-26 NOTE — Telephone Encounter (Signed)
Ext [phone]. PT called and stated that his refill of his clonazePAM (KLONOPIN) 0.5 MG tablet, has expired. He is leaving for the weekend and would like to have them for then. He would like them called into [facility] on [address]. Please assist.

## 2013-04-27 NOTE — Telephone Encounter (Signed)
Call in #[phone] with 5 rf 

## 2013-04-28 MED ORDER — CLONAZEPAM 0.5 MG PO TABS: 0.5000 mg | ORAL_TABLET | Freq: Two times a day (BID) | ORAL | Status: DC | PRN

## 2013-04-28 NOTE — Telephone Encounter (Signed)
I called in script and left voice message for pt. 

## 2013-05-02 ENCOUNTER — Encounter (HOSPITAL_COMMUNITY): Payer: Self-pay | Admitting: Emergency Medicine

## 2013-05-02 ENCOUNTER — Emergency Department (HOSPITAL_COMMUNITY)
Admission: EM | Admit: 2013-05-02 | Discharge: 2013-05-02 | Disposition: A | Discharge status: Home / Self Care | Payer: Self-pay | Attending: Emergency Medicine | Admitting: Emergency Medicine

## 2013-05-02 DIAGNOSIS — Y929 Unspecified place or not applicable: Secondary | ICD-10-CM | POA: Insufficient documentation

## 2013-05-02 DIAGNOSIS — Y939 Activity, unspecified: Secondary | ICD-10-CM | POA: Insufficient documentation

## 2013-05-02 DIAGNOSIS — X58XXXA Exposure to other specified factors, initial encounter: Secondary | ICD-10-CM | POA: Insufficient documentation

## 2013-05-02 DIAGNOSIS — K0889 Other specified disorders of teeth and supporting structures: Secondary | ICD-10-CM

## 2013-05-02 DIAGNOSIS — F410 Panic disorder [episodic paroxysmal anxiety] without agoraphobia: Secondary | ICD-10-CM | POA: Insufficient documentation

## 2013-05-02 DIAGNOSIS — Z9861 Coronary angioplasty status: Secondary | ICD-10-CM | POA: Insufficient documentation

## 2013-05-02 DIAGNOSIS — Z862 Personal history of diseases of the blood and blood-forming organs and certain disorders involving the immune mechanism: Secondary | ICD-10-CM | POA: Insufficient documentation

## 2013-05-02 DIAGNOSIS — I2510 Atherosclerotic heart disease of native coronary artery without angina pectoris: Secondary | ICD-10-CM | POA: Insufficient documentation

## 2013-05-02 DIAGNOSIS — R220 Localized swelling, mass and lump, head: Secondary | ICD-10-CM | POA: Insufficient documentation

## 2013-05-02 DIAGNOSIS — S025XXA Fracture of tooth (traumatic), initial encounter for closed fracture: Secondary | ICD-10-CM | POA: Insufficient documentation

## 2013-05-02 DIAGNOSIS — Z8639 Personal history of other endocrine, nutritional and metabolic disease: Secondary | ICD-10-CM | POA: Insufficient documentation

## 2013-05-02 DIAGNOSIS — K006 Disturbances in tooth eruption: Secondary | ICD-10-CM | POA: Insufficient documentation

## 2013-05-02 MED ORDER — IBUPROFEN 600 MG PO TABS: 600.0000 mg | ORAL_TABLET | Freq: Three times a day (TID) | ORAL | Status: DC | PRN

## 2013-05-02 MED ORDER — PENICILLIN V POTASSIUM 500 MG PO TABS: 500.0000 mg | ORAL_TABLET | Freq: Three times a day (TID) | ORAL | Status: DC

## 2013-05-02 MED ORDER — HYDROCODONE-ACETAMINOPHEN 5-325 MG PO TABS: 1.0000 | ORAL_TABLET | Freq: Four times a day (QID) | ORAL | Status: DC | PRN

## 2013-05-02 NOTE — ED Provider Notes (Signed)
CSN: [id number]     Arrival date & time 05/02/13  1817 History   First MD Initiated Contact with Patient 05/02/13 1832     Chief Complaint  Patient presents with  . Dental Pain   (Consider location/radiation/quality/duration/timing/severity/associated sxs/prior Treatment) Patient is a 47 y.o. male presenting with tooth pain. The history is provided by the patient. No language interpreter was used.  Dental Pain Location:  Lower Lower teeth location:  19/LL 1st molar Quality:  Throbbing Severity:  Severe Onset quality:  Gradual Duration:  2 days Timing:  Constant Progression:  Worsening Chronicity:  New Context: dental fracture and poor dentition   Associated symptoms: facial swelling and gum swelling     Past Medical History  Diagnosis Date  . Chest pain   . Hypercholesteremia   . Panic attack   . Coronary artery disease    Past Surgical History  Procedure Laterality Date  . Cardiac catheterization     Family History  Problem Relation Age of Onset  . Heart disease Father   . Emphysema    . Cancer     History  Substance Use Topics  . Smoking status: Current Every Day Smoker -- 1.00 packs/day for 34 years    Types: Cigarettes  . Smokeless tobacco: Never Used  . Alcohol Use: No    Review of Systems  HENT: Positive for facial swelling and dental problem.   All other systems reviewed and are negative.    Allergies  Doxycycline hyclate and Benadryl  Home Medications   Current Outpatient Rx  Name  Route  Sig  Dispense  Refill  . Aspirin-Acetaminophen (GOODY BODY PAIN) 500-325 MG PACK   Oral   Take 1 Package by mouth daily as needed (pain).         Marland Kitchen atenolol (TENORMIN) 25 MG tablet   Oral   Take 1 tablet (25 mg total) by mouth daily.   30 tablet   11   . clonazePAM (KLONOPIN) 0.5 MG tablet   Oral   Take 1 tablet (0.5 mg total) by mouth 2 (two) times daily as needed for anxiety.   60 tablet   5   . propranolol (INDERAL) 10 MG tablet   Oral   Take  1 tablet (10 mg total) by mouth every 6 (six) hours as needed (palpitations ).   90 tablet   11    BP 133/78  Pulse 91  Temp(Src) 98.4 F (36.9 C) (Oral)  Resp 18  SpO2 95% Physical Exam  Constitutional: He is oriented to person, place, and time. He appears well-developed and well-nourished.  HENT:  Head: Normocephalic.  Mouth/Throat:    Eyes: Conjunctivae are normal. Pupils are equal, round, and reactive to light.  Neck: Normal range of motion.  Cardiovascular: Normal rate, regular rhythm and normal heart sounds.   Pulmonary/Chest: Effort normal and breath sounds normal.  Abdominal: Soft. Bowel sounds are normal.  Musculoskeletal: Normal range of motion.  Lymphadenopathy:    He has no cervical adenopathy.  Neurological: He is alert and oriented to person, place, and time.  Skin: Skin is warm and dry.  Psychiatric: He has a normal mood and affect. His behavior is normal. Judgment and thought content normal.    ED Course  Procedures (including critical care time) Labs Review Labs Reviewed - No data to display Imaging Review No results found. Two day history of left lower tooth pain.  Mild , localized surrounding swelling.  No difficulty swallowing, no trismus.  Antibiotic, NSAID,  dental follow-up.  No adult dental coverage today--provided with resource guide, information on Civil dentistry and upcoming dental clinic. MDM    Dental pain.    Jimmye Norman, NP 05/02/13 [phone]

## 2013-05-02 NOTE — ED Notes (Signed)
Pt c/o toothache since yesterday.  

## 2013-05-03 NOTE — ED Provider Notes (Signed)
Medical screening examination/treatment/procedure(s) were performed by non-physician practitioner and as supervising physician I was immediately available for consultation/collaboration.    Arna Luis L Charish Schroepfer, MD 05/03/13 1457 

## 2013-05-24 ENCOUNTER — Ambulatory Visit: Payer: Self-pay | Admitting: Family Medicine

## 2013-06-27 ENCOUNTER — Ambulatory Visit (INDEPENDENT_AMBULATORY_CARE_PROVIDER_SITE_OTHER): Payer: Self-pay | Admitting: Family Medicine

## 2013-06-27 ENCOUNTER — Encounter: Payer: Self-pay | Admitting: Family Medicine

## 2013-06-27 VITALS — BP 122/80 | HR 98 | Temp 98.1°F | Wt 218.0 lb

## 2013-06-27 DIAGNOSIS — F411 Generalized anxiety disorder: Secondary | ICD-10-CM

## 2013-06-27 DIAGNOSIS — R002 Palpitations: Secondary | ICD-10-CM

## 2013-06-27 DIAGNOSIS — I2510 Atherosclerotic heart disease of native coronary artery without angina pectoris: Secondary | ICD-10-CM

## 2013-06-27 MED ORDER — ATENOLOL 25 MG PO TABS: 25.0000 mg | ORAL_TABLET | Freq: Two times a day (BID) | ORAL | Status: DC

## 2013-06-27 NOTE — Progress Notes (Signed)
  Subjective:    Patient ID: Dylan Houston, male    DOB: November 23, 1965, 47 y.o.   MRN: [id number]  HPI He has been having some spells of palpitations lately and he asks if his dose of Atenolol can be increased. No SOB or chest pain. His BP is stable.    Review of Systems  Constitutional: Negative.   Respiratory: Negative.   Cardiovascular: Positive for palpitations. Negative for chest pain and leg swelling.       Objective:   Physical Exam  Constitutional: He appears well-developed and well-nourished.  Cardiovascular: Normal rate, regular rhythm, normal heart sounds and intact distal pulses.   Pulmonary/Chest: Effort normal and breath sounds normal.  Musculoskeletal: He exhibits no edema.          Assessment & Plan:  We will increase the Atenolol to 25 mg bid. Set up fasting labs soon. Recheck in one month

## 2013-07-04 ENCOUNTER — Ambulatory Visit: Payer: Self-pay | Admitting: Family Medicine

## 2013-07-04 ENCOUNTER — Other Ambulatory Visit: Payer: Self-pay

## 2013-07-06 ENCOUNTER — Encounter: Payer: Self-pay | Admitting: Family Medicine

## 2013-07-07 ENCOUNTER — Other Ambulatory Visit: Payer: Self-pay

## 2013-07-28 ENCOUNTER — Other Ambulatory Visit: Payer: Self-pay

## 2013-08-04 ENCOUNTER — Other Ambulatory Visit: Payer: Self-pay

## 2013-08-15 ENCOUNTER — Other Ambulatory Visit: Payer: Self-pay

## 2013-10-14 ENCOUNTER — Emergency Department (HOSPITAL_COMMUNITY)
Admission: EM | Admit: 2013-10-14 | Discharge: 2013-10-14 | Disposition: A | Discharge status: Home / Self Care | Payer: Self-pay | Attending: Emergency Medicine | Admitting: Emergency Medicine

## 2013-10-14 ENCOUNTER — Encounter (HOSPITAL_COMMUNITY): Payer: Self-pay | Admitting: Emergency Medicine

## 2013-10-14 DIAGNOSIS — Z9889 Other specified postprocedural states: Secondary | ICD-10-CM | POA: Insufficient documentation

## 2013-10-14 DIAGNOSIS — I2510 Atherosclerotic heart disease of native coronary artery without angina pectoris: Secondary | ICD-10-CM | POA: Insufficient documentation

## 2013-10-14 DIAGNOSIS — Z79899 Other long term (current) drug therapy: Secondary | ICD-10-CM | POA: Insufficient documentation

## 2013-10-14 DIAGNOSIS — Z8639 Personal history of other endocrine, nutritional and metabolic disease: Secondary | ICD-10-CM | POA: Insufficient documentation

## 2013-10-14 DIAGNOSIS — K047 Periapical abscess without sinus: Secondary | ICD-10-CM | POA: Insufficient documentation

## 2013-10-14 DIAGNOSIS — F17200 Nicotine dependence, unspecified, uncomplicated: Secondary | ICD-10-CM | POA: Insufficient documentation

## 2013-10-14 DIAGNOSIS — Z8659 Personal history of other mental and behavioral disorders: Secondary | ICD-10-CM | POA: Insufficient documentation

## 2013-10-14 DIAGNOSIS — Z862 Personal history of diseases of the blood and blood-forming organs and certain disorders involving the immune mechanism: Secondary | ICD-10-CM | POA: Insufficient documentation

## 2013-10-14 MED ORDER — PENICILLIN V POTASSIUM 500 MG PO TABS: 500.0000 mg | ORAL_TABLET | Freq: Four times a day (QID) | ORAL | Status: DC

## 2013-10-14 MED ORDER — IBUPROFEN 800 MG PO TABS: 800.0000 mg | ORAL_TABLET | Freq: Three times a day (TID) | ORAL | Status: DC | PRN

## 2013-10-14 MED ORDER — HYDROCODONE-ACETAMINOPHEN 5-325 MG PO TABS: 1.0000 | ORAL_TABLET | Freq: Four times a day (QID) | ORAL | Status: DC | PRN

## 2013-10-14 NOTE — Discharge Instructions (Signed)
Return here as needed. Follow up with the oral surgeon °

## 2013-10-14 NOTE — ED Provider Notes (Signed)
CSN: [id number]     Arrival date & time 10/14/13  1354 History  This chart was scribed for non-physician practitioner, Ebbie Ridge, PA-C,working with Hilario Quarry, MD, by Karle Plumber, ED Scribe.  This patient was seen in room WTR8/WTR8 and the patient's care was started at 3:23 PM.  Chief Complaint  Patient presents with  . Dental Pain   The history is provided by the patient. No language interpreter was used.   HPI Comments:  Dylan Houston is a 48 y.o. male who presents to the Emergency Department complaining of severe lower left gum pain that started approximately two days ago. Pt reports associated swelling of the area. He reports taking Goody Powder for the pain with mild relief. Pt denies fever, vomiting, or drainage.   Past Medical History  Diagnosis Date  . Chest pain   . Hypercholesteremia   . Panic attack   . Coronary artery disease    Past Surgical History  Procedure Laterality Date  . Cardiac catheterization     Family History  Problem Relation Age of Onset  . Heart disease Father   . Emphysema    . Cancer     History  Substance Use Topics  . Smoking status: Current Every Day Smoker -- 1.00 packs/day for 34 years    Types: Cigarettes  . Smokeless tobacco: Never Used  . Alcohol Use: No    Review of Systems A complete 10 system review of systems was obtained and all systems are negative except as noted in the HPI and PMH.   Allergies  Doxycycline hyclate and Benadryl  Home Medications   Current Outpatient Rx  Name  Route  Sig  Dispense  Refill  . Aspirin-Acetaminophen (GOODY BODY PAIN) 500-325 MG PACK   Oral   Take 1 Package by mouth daily as needed (pain).         Marland Kitchen atenolol (TENORMIN) 25 MG tablet   Oral   Take 1 tablet (25 mg total) by mouth 2 (two) times daily.   60 tablet   11   . clonazePAM (KLONOPIN) 0.5 MG tablet   Oral   Take 1 tablet (0.5 mg total) by mouth 2 (two) times daily as needed for anxiety.   60 tablet   5   .  HYDROcodone-acetaminophen (NORCO/VICODIN) 5-325 MG per tablet   Oral   Take 1 tablet by mouth every 6 (six) hours as needed for pain.   10 tablet   0    Triage Vitals: BP 149/87  Pulse 105  Temp(Src) 97.9 F (36.6 C) (Oral)  Resp 18  SpO2 100% Physical Exam  Nursing note and vitals reviewed. Constitutional: He is oriented to person, place, and time. He appears well-developed and well-nourished. No distress.  HENT:  Head: Normocephalic and atraumatic.  Mouth/Throat: Uvula is midline, oropharynx is clear and moist and mucous membranes are normal. Dental abscesses present.  Eyes: EOM are normal.  Neck: Normal range of motion.  Cardiovascular: Normal rate.   Pulmonary/Chest: Effort normal.  Musculoskeletal: Normal range of motion.  Neurological: He is alert and oriented to person, place, and time.  Skin: Skin is warm and dry.  Psychiatric: He has a normal mood and affect. His behavior is normal.    ED Course  Procedures (including critical care time) DIAGNOSTIC STUDIES: Oxygen Saturation is 100% on RA, normal by my interpretation.   COORDINATION OF CARE: 3:25 PM- Will refer to oral surgeon and prescribe antibiotics. Offered to I & D but pt  decline. Pt verbalizes understanding and agrees to plan.     I personally performed the services described in this documentation, which was scribed in my presence. The recorded information has been reviewed and is accurate.    Carlyle Dollyhristopher W Raynah Gomes, PA-C 10/14/13 1539

## 2013-10-14 NOTE — ED Notes (Signed)
Per pt, states left lower tooth pain

## 2013-10-15 NOTE — ED Provider Notes (Signed)
History/physical exam/procedure(s) were performed by non-physician practitioner and as supervising physician I was immediately available for consultation/collaboration. I have reviewed all notes and am in agreement with care and plan.   Paticia Moster S Tadao Emig, MD 10/15/13 0017 

## 2013-11-15 ENCOUNTER — Ambulatory Visit: Payer: Self-pay | Admitting: Pulmonary Disease

## 2013-12-11 ENCOUNTER — Ambulatory Visit: Payer: Self-pay | Admitting: Family Medicine

## 2013-12-21 ENCOUNTER — Ambulatory Visit: Payer: Self-pay | Admitting: Family Medicine

## 2013-12-21 DIAGNOSIS — Z0289 Encounter for other administrative examinations: Secondary | ICD-10-CM

## 2014-01-09 ENCOUNTER — Telehealth: Payer: Self-pay | Admitting: Family Medicine

## 2014-01-09 ENCOUNTER — Ambulatory Visit (INDEPENDENT_AMBULATORY_CARE_PROVIDER_SITE_OTHER): Payer: Self-pay | Admitting: Family Medicine

## 2014-01-09 ENCOUNTER — Encounter: Payer: Self-pay | Admitting: Family Medicine

## 2014-01-09 VITALS — BP 144/93 | HR 99 | Temp 98.9°F | Ht 64.0 in | Wt 220.0 lb

## 2014-01-09 DIAGNOSIS — R002 Palpitations: Secondary | ICD-10-CM

## 2014-01-09 DIAGNOSIS — J019 Acute sinusitis, unspecified: Secondary | ICD-10-CM

## 2014-01-09 DIAGNOSIS — I10 Essential (primary) hypertension: Secondary | ICD-10-CM

## 2014-01-09 MED ORDER — AMLODIPINE BESYLATE 5 MG PO TABS: 5.0000 mg | ORAL_TABLET | Freq: Every day | ORAL | Status: DC

## 2014-01-09 MED ORDER — AMOXICILLIN-POT CLAVULANATE 875-125 MG PO TABS: 1.0000 | ORAL_TABLET | Freq: Two times a day (BID) | ORAL | Status: DC

## 2014-01-09 NOTE — Progress Notes (Signed)
Pre visit review using our clinic review tool, if applicable. No additional management support is needed unless otherwise documented below in the visit note. 

## 2014-01-09 NOTE — Telephone Encounter (Signed)
Relevant patient education mailed to patient.  

## 2014-01-09 NOTE — Telephone Encounter (Signed)
Relevant patient education assigned to patient using Emmi. ° °

## 2014-01-09 NOTE — Progress Notes (Signed)
   Subjective:    Patient ID: Dylan Houston, male    DOB: April 29, 1966, 48 y.o.   MRN: [id number]  HPI Here for one week of sinus pressure, PND, and a dry cough. Using Claritin D. Also he stopped taking Atenolol a few months ago because it made him lightheaded when he gets up and down.    Review of Systems  Constitutional: Negative.   HENT: Positive for congestion, postnasal drip and sinus pressure.   Eyes: Negative.   Respiratory: Positive for cough.        Objective:   Physical Exam  Constitutional: He appears well-developed and well-nourished.  HENT:  Right Ear: External ear normal.  Left Ear: External ear normal.  Nose: Nose normal.  Mouth/Throat: Oropharynx is clear and moist.  Eyes: Conjunctivae are normal.  Cardiovascular: Normal rate, regular rhythm, normal heart sounds and intact distal pulses.   Pulmonary/Chest: Effort normal and breath sounds normal.  Lymphadenopathy:    He has no cervical adenopathy.          Assessment & Plan:  Treat with Augmentin. Try Norvasc for the HTN.

## 2014-06-05 ENCOUNTER — Ambulatory Visit (INDEPENDENT_AMBULATORY_CARE_PROVIDER_SITE_OTHER): Payer: Self-pay | Admitting: Family Medicine

## 2014-06-05 ENCOUNTER — Encounter: Payer: Self-pay | Admitting: Family Medicine

## 2014-06-05 VITALS — BP 132/74 | HR 115 | Temp 98.9°F | Ht 64.0 in | Wt 216.0 lb

## 2014-06-05 DIAGNOSIS — R002 Palpitations: Secondary | ICD-10-CM

## 2014-06-05 DIAGNOSIS — I10 Essential (primary) hypertension: Secondary | ICD-10-CM

## 2014-06-05 MED ORDER — METOPROLOL SUCCINATE ER 50 MG PO TB24: 50.0000 mg | ORAL_TABLET | Freq: Every day | ORAL | Status: DC

## 2014-06-05 NOTE — Progress Notes (Signed)
Pre visit review using our clinic review tool, if applicable. No additional management support is needed unless otherwise documented below in the visit note. 

## 2014-06-05 NOTE — Progress Notes (Signed)
   Subjective:    Patient ID: Dylan Houston, male    DOB: [birth year], 48 y.o.   MRN: [id number]  HPI Here for several days of heart racing and palpitations. He has felt lightheaded at times but not dizzy. No chest pain or SOB. He had tried Atenolol for these palpitations in the past but this was changed to Norvasc last May.    Review of Systems  Constitutional: Negative.   Respiratory: Negative.   Cardiovascular: Positive for palpitations. Negative for chest pain and leg swelling.  Neurological: Positive for light-headedness. Negative for dizziness, tremors, seizures, syncope, facial asymmetry, speech difficulty, weakness, numbness and headaches.       Objective:   Physical Exam  Constitutional: He is oriented to person, place, and time. He appears well-developed and well-nourished. No distress.  Cardiovascular: Regular rhythm, normal heart sounds and intact distal pulses.  Exam reveals no gallop and no friction rub.   No murmur heard. Rapid rate   Pulmonary/Chest: Effort normal and breath sounds normal. No respiratory distress. He has no wheezes. He has no rales. He exhibits no tenderness.  Neurological: He is alert and oriented to person, place, and time.          Assessment & Plan:  Switch to Metoprolol succinate 50 mg daily. Check back in one month

## 2014-06-30 ENCOUNTER — Emergency Department (HOSPITAL_COMMUNITY)
Admission: EM | Admit: 2014-06-30 | Discharge: 2014-06-30 | Disposition: A | Discharge status: Home / Self Care | Payer: Self-pay | Attending: Emergency Medicine | Admitting: Emergency Medicine

## 2014-06-30 ENCOUNTER — Encounter (HOSPITAL_COMMUNITY): Payer: Self-pay

## 2014-06-30 DIAGNOSIS — K047 Periapical abscess without sinus: Secondary | ICD-10-CM | POA: Insufficient documentation

## 2014-06-30 DIAGNOSIS — I10 Essential (primary) hypertension: Secondary | ICD-10-CM | POA: Insufficient documentation

## 2014-06-30 DIAGNOSIS — Z8639 Personal history of other endocrine, nutritional and metabolic disease: Secondary | ICD-10-CM | POA: Insufficient documentation

## 2014-06-30 DIAGNOSIS — Z79899 Other long term (current) drug therapy: Secondary | ICD-10-CM | POA: Insufficient documentation

## 2014-06-30 DIAGNOSIS — Z9889 Other specified postprocedural states: Secondary | ICD-10-CM | POA: Insufficient documentation

## 2014-06-30 DIAGNOSIS — Z8659 Personal history of other mental and behavioral disorders: Secondary | ICD-10-CM | POA: Insufficient documentation

## 2014-06-30 DIAGNOSIS — R079 Chest pain, unspecified: Secondary | ICD-10-CM | POA: Insufficient documentation

## 2014-06-30 DIAGNOSIS — R600 Localized edema: Secondary | ICD-10-CM | POA: Insufficient documentation

## 2014-06-30 DIAGNOSIS — Z720 Tobacco use: Secondary | ICD-10-CM | POA: Insufficient documentation

## 2014-06-30 DIAGNOSIS — I2510 Atherosclerotic heart disease of native coronary artery without angina pectoris: Secondary | ICD-10-CM | POA: Insufficient documentation

## 2014-06-30 MED ORDER — HYDROCODONE-ACETAMINOPHEN 5-325 MG PO TABS
2.0000 | ORAL_TABLET | Freq: Once | ORAL | Status: DC
  Filled 2014-06-30: qty 2

## 2014-06-30 MED ORDER — PENICILLIN V POTASSIUM 250 MG PO TABS
500.0000 mg | ORAL_TABLET | Freq: Once | ORAL | Status: AC
  Administered 2014-06-30: 500 mg via ORAL
  Filled 2014-06-30: qty 2

## 2014-06-30 MED ORDER — PENICILLIN V POTASSIUM 500 MG PO TABS: 500.0000 mg | ORAL_TABLET | Freq: Four times a day (QID) | ORAL | Status: AC

## 2014-06-30 MED ORDER — HYDROCODONE-ACETAMINOPHEN 5-325 MG PO TABS: 1.0000 | ORAL_TABLET | ORAL | Status: DC | PRN

## 2014-06-30 NOTE — ED Notes (Signed)
Pt reports R lower dental pain yesterday.  Pt woke today with R facial swelling.

## 2014-06-30 NOTE — ED Provider Notes (Signed)
CSN: [id number]     Arrival date & time 06/30/14  [id number] History   First MD Initiated Contact with Patient 06/30/14 1000     Chief Complaint  Patient presents with  . Dental Pain   (Consider location/radiation/quality/duration/timing/severity/associated sxs/prior Treatment) HPI Dylan Houston is a 48 yo male presenting with rt lower dental pain x1 week. He has several broken tooth. He woke up this am with rt sided facial swelling. He reports history of tachycardia and takes metoprolol but has missed yesterday's and this am's dose.  Plans to take with food when he leaves ED.  He denies fever, chills, shortness of breath, or difficulty drinking.    Past Medical History  Diagnosis Date  . Chest pain   . Hypercholesteremia   . Panic attack   . Coronary artery disease   . Hypertension    Past Surgical History  Procedure Laterality Date  . Cardiac catheterization     Family History  Problem Relation Age of Onset  . Heart disease Father   . Emphysema    . Cancer     History  Substance Use Topics  . Smoking status: Current Every Day Smoker -- 1.00 packs/day for 34 years    Types: Cigarettes  . Smokeless tobacco: Never Used  . Alcohol Use: No    Review of Systems  Constitutional: Negative for fever.  HENT: Positive for facial swelling. Negative for sore throat.   Respiratory: Negative for shortness of breath.   Cardiovascular: Negative for chest pain.  Gastrointestinal: Negative for nausea and vomiting.  Genitourinary: Negative for dysuria.  Skin: Negative for rash.  Neurological: Negative for weakness, numbness and headaches.    Allergies  Doxycycline hyclate and Benadryl  Home Medications   Prior to Admission medications   Medication Sig Start Date End Date Taking? Authorizing Provider  Aspirin-Acetaminophen (GOODY BODY PAIN) 500-325 MG PACK Take 1 Package by mouth daily as needed (pain).    Historical Provider, MD  metoprolol succinate (TOPROL XL) 50 MG 24 hr tablet  Take 1 tablet (50 mg total) by mouth daily. Take with or immediately following a meal. 06/05/14   Nelwyn SalisburyStephen A Fry, MD   BP 114/85 mmHg  Pulse 129  Temp(Src) 97.7 F (36.5 C) (Oral)  Resp 15  Ht 5\' 7"  (1.702 m)  Wt 216 lb (97.977 kg)  BMI 33.82 kg/m2  SpO2 96% Physical Exam  Constitutional: He appears well-developed and well-nourished. No distress.  HENT:  Head: Normocephalic and atraumatic.  Mouth/Throat:    Eyes: Conjunctivae are normal. Right eye exhibits no discharge. Left eye exhibits no discharge. No scleral icterus.  Cardiovascular: Intact distal pulses.   Pulmonary/Chest: Effort normal.  Neurological: He is alert. Coordination normal.  Skin: He is not diaphoretic.  Nursing note and vitals reviewed.   ED Course  Procedures (including critical care time) Labs Review Labs Reviewed - No data to display  Imaging Review No results found.   EKG Interpretation None      MDM   Final diagnoses:  Dental abscess   Patient with toothache and right sided facial swelling.  Exam unconcerning for Ludwig's angina. Treated with penicillin and pain medicine.  Discharge instructions include prescription for abx and pain meds and referral to follow-up with dentist.  Manually counted heart rate prior to discharge is 104 bpm.  Pt is well appearing, afebrile, aware of plan and in agreement. Return precautions provided.   Filed Vitals:   06/30/14 0954 06/30/14 1032 06/30/14 1055  BP: 114/85 112/87  Pulse: 129 116 104  Temp: 97.7 F (36.5 C)    TempSrc: Oral    Resp: 15 18   Height: 5\' 7"  (1.702 m)    Weight: 216 lb (97.977 kg)    SpO2: 96% 99%    Meds given in ED:  Medications  HYDROcodone-acetaminophen (NORCO/VICODIN) 5-325 MG per tablet 2 tablet (2 tablets Oral Not Given 06/30/14 1026)  penicillin v potassium (VEETID) tablet 500 mg (500 mg Oral Given 06/30/14 1026)    Discharge Medication List as of 06/30/2014 10:49 AM    START taking these medications   Details    HYDROcodone-acetaminophen (NORCO/VICODIN) 5-325 MG per tablet Take 1-2 tablets by mouth every 4 (four) hours as needed for moderate pain or severe pain., Starting 06/30/2014, Until Discontinued, Print    penicillin v potassium (VEETID) 500 MG tablet Take 1 tablet (500 mg total) by mouth 4 (four) times daily., Starting 06/30/2014, Until Sat 07/07/14, Print           Harle BattiestElizabeth Medina Degraffenreid, NP 07/02/14 [id number]  Geoffery Lyonsouglas Delo, MD 07/02/14 1750

## 2014-06-30 NOTE — Discharge Instructions (Signed)
Please follow the directions provided.  It is very important to follow-up with the dentist referral provided to fix your teeth. Please take your antibiotic as directed.  Please take your metoprolol every day as prescribed and be sure to follow-up with your primary care provider regarding any questions or concerns regarding your heart rate.    SEEK IMMEDIATE MEDICAL CARE IF:  You have a fever or persistent symptoms for more than 2-3 days.  You have a fever and your symptoms suddenly get worse.  You have chills or a very bad headache.  You have problems breathing or swallowing.  You have trouble opening your mouth.  You have swelling in the neck or around the eye.

## 2014-06-30 NOTE — ED Notes (Signed)
Pt states R lower molar pain, onset yesterday. Swelling noted to R jaw upon arrival to ED. 9/10 pain at present. Denies difficulty swallowing. Respirations unlabored.

## 2014-07-05 ENCOUNTER — Emergency Department (HOSPITAL_COMMUNITY)
Admission: EM | Admit: 2014-07-05 | Discharge: 2014-07-05 | Discharge status: Against Medical Advice | Payer: Self-pay | Attending: Emergency Medicine | Admitting: Emergency Medicine

## 2014-07-05 ENCOUNTER — Encounter (HOSPITAL_COMMUNITY): Payer: Self-pay | Admitting: *Deleted

## 2014-07-05 DIAGNOSIS — I2510 Atherosclerotic heart disease of native coronary artery without angina pectoris: Secondary | ICD-10-CM | POA: Insufficient documentation

## 2014-07-05 DIAGNOSIS — I10 Essential (primary) hypertension: Secondary | ICD-10-CM | POA: Insufficient documentation

## 2014-07-05 DIAGNOSIS — Z720 Tobacco use: Secondary | ICD-10-CM | POA: Insufficient documentation

## 2014-07-05 DIAGNOSIS — K088 Other specified disorders of teeth and supporting structures: Secondary | ICD-10-CM | POA: Insufficient documentation

## 2014-07-05 DIAGNOSIS — K047 Periapical abscess without sinus: Secondary | ICD-10-CM | POA: Insufficient documentation

## 2014-07-05 NOTE — ED Notes (Signed)
Patient presents via EMS with c/o dental pain.  Diagnosed with dental abcess but thought he was to wait until all the antibiotics were done before he called for an appointment.

## 2014-07-05 NOTE — ED Notes (Signed)
Unable to locate patient x30 min, called multiple times for room

## 2015-02-09 ENCOUNTER — Encounter (HOSPITAL_COMMUNITY): Payer: Self-pay | Admitting: Emergency Medicine

## 2015-02-09 ENCOUNTER — Emergency Department (HOSPITAL_COMMUNITY)
Admission: EM | Admit: 2015-02-09 | Discharge: 2015-02-09 | Disposition: A | Discharge status: Home / Self Care | Payer: Self-pay | Attending: Emergency Medicine | Admitting: Emergency Medicine

## 2015-02-09 DIAGNOSIS — Z8639 Personal history of other endocrine, nutritional and metabolic disease: Secondary | ICD-10-CM | POA: Insufficient documentation

## 2015-02-09 DIAGNOSIS — I10 Essential (primary) hypertension: Secondary | ICD-10-CM | POA: Insufficient documentation

## 2015-02-09 DIAGNOSIS — K029 Dental caries, unspecified: Secondary | ICD-10-CM | POA: Insufficient documentation

## 2015-02-09 DIAGNOSIS — I2510 Atherosclerotic heart disease of native coronary artery without angina pectoris: Secondary | ICD-10-CM | POA: Insufficient documentation

## 2015-02-09 DIAGNOSIS — Z8659 Personal history of other mental and behavioral disorders: Secondary | ICD-10-CM | POA: Insufficient documentation

## 2015-02-09 DIAGNOSIS — Z720 Tobacco use: Secondary | ICD-10-CM | POA: Insufficient documentation

## 2015-02-09 DIAGNOSIS — K0889 Other specified disorders of teeth and supporting structures: Secondary | ICD-10-CM

## 2015-02-09 DIAGNOSIS — K088 Other specified disorders of teeth and supporting structures: Secondary | ICD-10-CM | POA: Insufficient documentation

## 2015-02-09 MED ORDER — AMOXICILLIN 500 MG PO CAPS: 500.0000 mg | ORAL_CAPSULE | Freq: Three times a day (TID) | ORAL | Status: DC

## 2015-02-09 MED ORDER — AMOXICILLIN 500 MG PO CAPS
500.0000 mg | ORAL_CAPSULE | Freq: Once | ORAL | Status: AC
  Administered 2015-02-09: 500 mg via ORAL
  Filled 2015-02-09: qty 1

## 2015-02-09 NOTE — ED Notes (Signed)
Pt states he had a tooth break Thursday night. He feels like the left lower side of his jaw is starting to swell mildly and states he wants to go ahead and get started on some antibiotics before it got worse. Poor dentition, obviously broken/caried tooth to left posterior jaw

## 2015-02-09 NOTE — Discharge Instructions (Signed)
Dental Pain °A tooth ache may be caused by cavities (tooth decay). Cavities expose the nerve of the tooth to air and hot or cold temperatures. It may come from an infection or abscess (also called a boil or furuncle) around your tooth. It is also often caused by dental caries (tooth decay). This causes the pain you are having. °DIAGNOSIS  °Your caregiver can diagnose this problem by exam. °TREATMENT  °· If caused by an infection, it may be treated with medications which kill germs (antibiotics) and pain medications as prescribed by your caregiver. Take medications as directed. °· Only take over-the-counter or prescription medicines for pain, discomfort, or fever as directed by your caregiver. °· Whether the tooth ache today is caused by infection or dental disease, you should see your dentist as soon as possible for further care. °SEEK MEDICAL CARE IF: °The exam and treatment you received today has been provided on an emergency basis only. This is not a substitute for complete medical or dental care. If your problem worsens or new problems (symptoms) appear, and you are unable to meet with your dentist, call or return to this location. °SEEK IMMEDIATE MEDICAL CARE IF:  °· You have a fever. °· You develop redness and swelling of your face, jaw, or neck. °· You are unable to open your mouth. °· You have severe pain uncontrolled by pain medicine. °MAKE SURE YOU:  °· Understand these instructions. °· Will watch your condition. °· Will get help right away if you are not doing well or get worse. °Document Released: 08/10/2005 Document Revised: 11/02/2011 Document Reviewed: 03/28/2008 °ExitCare® Patient Information ©2015 [employer]. This information is not intended to replace advice given to you by your health care provider. Make sure you discuss any questions you have with your health care provider. ° °Emergency Department Resource Guide °1) Find a Doctor and Pay Out of Pocket °Although you won't have to find out who  is covered by your insurance plan, it is a good idea to ask around and get recommendations. You will then need to call the office and see if the doctor you have chosen will accept you as a new patient and what types of options they offer for patients who are self-pay. Some doctors offer discounts or will set up payment plans for their patients who do not have insurance, but you will need to ask so you aren't surprised when you get to your appointment. ° °2) Contact Your Local Health Department °Not all health departments have doctors that can see patients for sick visits, but many do, so it is worth a call to see if yours does. If you don't know where your local health department is, you can check in your phone book. The CDC also has a tool to help you locate your state's health department, and many state websites also have listings of all of their local health departments. ° °3) Find a Walk-in Clinic °If your illness is not likely to be very severe or complicated, you may want to try a walk in clinic. These are popping up all over the country in pharmacies, drugstores, and shopping centers. They're usually staffed by nurse practitioners or physician assistants that have been trained to treat common illnesses and complaints. They're usually fairly quick and inexpensive. However, if you have serious medical issues or chronic medical problems, these are probably not your best option. ° °No Primary Care Doctor: °- Call [facility] at  832-8000 - they can help you locate a primary   care doctor that  accepts your insurance, provides certain services, etc. °- Physician Referral Service- 1-800-533-3463 ° °Chronic Pain Problems: °Organization         Address  Phone   Notes  °[facility]  ([phone]) [phone] Patients need to be referred by their primary care doctor.  ° °Medication Assistance: °Organization         Address  Phone   Notes  °[place] [facility] [address]  [address] °[place], [place] [address] (336) 641-8030 --Must be a resident of [place] °-- Must have NO insurance coverage whatsoever (no Medicaid/ Medicare, etc.) °-- The pt. MUST have a primary care doctor that directs their care regularly and follows them in the community °  °[facility]  (866) 331-1348   °[employer]  (888) 892-1162   ° °Agencies that provide inexpensive medical care: °Organization         Address  Phone   Notes  °[facility]  ([phone]) [phone]   °[facility]    (336) 832-7272   °[facility] [address] °[place], [place] [address] (336) 832-4777   °[facility] [address], °[place] (336) 271-4999   °[facility]    (336) 373-0678   °[facility]    (336) 272-1050   °[facility] ° [address], [place] Phone:  (336) 832-4444, Fax:  (336) 832-4440 Hours of Operation:  9 am - 6 pm, M-F.  Also accepts Medicaid/Medicare and self-pay.  °[facility] ° [address], [place] Phone: (336) [phone], Fax: ([phone]) [phone]. Hours of Operation:  8:30 am - 5:30 pm, M-F.  Also accepts Medicaid and self-pay.  °[facility] [place] [address], [place] Phone: (336) [phone]   °[facility] [address], [place], [place] (336)723-1848, Ext. 123 Mondays & Thursdays: 7-9 AM.  First 15 patients are seen on a first come, first serve basis. °  ° °Medicaid-accepting [place] Providers: ° °Organization         Address  Phone   Notes  °[facility] [address], [place] ([phone]) [phone] Also accepts self-pay patients.  °[facility] [address], [place] ° (336) [phone]   °[facility] [address], [place] (336) [phone]   °[facility] [address] [place] [address], [place] (336) [phone]   °[facility] [address], [place]  ° ([phone]) [phone] Only accepts [place] Access Medicaid patients after they have their name applied to their card.  ° °Self-Pay (no insurance) in [place]: ° °Organization         Address  Phone   Notes  °Sickle Cell Patients, [facility] [address], [place] (336) [phone]   °[facility] [address], [place] (336) [phone]   °[facility] ° [address], [place] (336) [phone]   °[facility]/Dr. Osei-Bonsu ° [address] [place] [address], [place] or [address], [place] ([phone]) [phone] Phone number for both [place] and [place] locations is the same.  °[facility] [address], [place] (336) [phone]   °[facility] [address] [place] [address],  or [address] (336) 852-7530 °(336) 878-2260   °  [facility] [address], [place] (336) 350-1642, phone; (336) [phone], fax Sees patients 1st and 3rd Saturday of every month.  Must not qualify for public or private insurance (i.e. Medicaid, Medicare, [place] Health Choice, Veterans' Benefits) • Household income should be no more than 200% of the poverty level •The clinic cannot treat you if you are pregnant or think you are pregnant • Sexually transmitted diseases are not treated at the clinic.  ° ° °Dental Care: °Organization         Address  Phone  Notes  °[facility] [address], [place] (336) [phone] Accepts children up to age 21 who are enrolled in Medicaid or [place] Health Choice; pregnant women with a Medicaid card; and children who have applied for Medicaid or [place] Health Choice, but were declined, whose parents can pay a reduced fee at time of service.  °[facility] [place]  [address], [place] (336) 641-7733 Accepts children up to age 21 who are  enrolled in Medicaid or [place] Health Choice; pregnant women with a Medicaid card; and children who have applied for Medicaid or [place] Health Choice, but were declined, whose parents can pay a reduced fee at time of service.  °[facility] ° [address], [place] (336) [phone] Patients are seen by appointment only. Walk-ins are not accepted. [facility] will see patients 18 years of age and older. °Monday - Tuesday (8am-5pm) °Most Wednesdays (8:30-5pm) °$30 per visit, cash only  °[facility] ° [address], [place] ([phone]) [phone] Patients are seen by appointment only. Walk-ins are not accepted. [facility] will see patients 18 years of age and older. °One Wednesday Evening (Monthly: Volunteer Based).  $30 per visit, cash only  °[facility]  ([phone]) [phone] for adults; Children under age 4, call Graduate Pediatric Dentistry at ([phone]) [phone]. Children aged 4-14, please call ([phone]) [phone] to request a pediatric application. ° Dental services are provided in all areas of dental care including fillings, crowns and bridges, complete and partial dentures, implants, gum treatment, root canals, and extractions. Preventive care is also provided. Treatment is provided to both adults and children. °Patients are selected via a lottery and there is often a waiting list. °  °[facility] [address], °[place] ° (336) 763-8833 [email or web address] °  °[facility] [address], [place], [place] (336)723-1848, Ext. 123 Second and Fourth Thursday of each month, opens at 6:30 AM; Clinic ends at 9 AM.  Patients are seen on a first-come first-served basis, and a limited number are seen during each clinic.  ° °[facility] ° [address], [place], [place] ([phone]) [phone]   Eligibility Requirements °You must have lived in [place], [place], or [place] counties for at least the last three months. °  You  cannot be eligible for state or federal sponsored healthcare insurance, including Veterans Administration, Medicaid, or Medicare. °  You generally cannot be eligible for healthcare insurance through your employer.  °  How to apply: °Eligibility screenings are held every Tuesday and Wednesday afternoon from 1:00 pm until 4:00 pm. You do not need an appointment for the interview!  °[facility] [address], [place],  [phone]   °[facility]  [phone]   °[facility]  [phone]   °[facility]  [phone]   °  °

## 2015-02-09 NOTE — ED Provider Notes (Signed)
CSN: [id number]     Arrival date & time 02/09/15  1925 History  This chart was scribed for Dylan Madura, PA-C, working with Dylan Dibbles, MD, by Dylan Houston, ED Scribe. This patient was seen in room WTR8/WTR8 and the patient's care was started at 8:09 PM.   Chief Complaint  Patient presents with  . Dental Pain   The history is provided by the patient and medical records. No language interpreter was used.    HPI Comments: [name] is a 49 y.o. male who presents to the Emergency Department complaining of constant, worsening, severe left lower second molar pain and gingival swelling after his tooth broke 2 days ago. He endorses a subjective associated fever. Patient has taken ibuprofen and liquid Anbesol with only some relief. He also tried a Radiographer, therapeutic with no relief. Per nursing notes, patient states he is worried about infection and wants to start an antibiotic before his symptoms worsen.  Past Medical History  Diagnosis Date  . Chest pain   . Hypercholesteremia   . Panic attack   . Coronary artery disease   . Hypertension    Past Surgical History  Procedure Laterality Date  . Cardiac catheterization     Family History  Problem Relation Age of Onset  . Heart disease Father   . Emphysema    . Cancer     History  Substance Use Topics  . Smoking status: Current Every Day Smoker -- 1.00 packs/day for 34 years    Types: Cigarettes  . Smokeless tobacco: Never Used  . Alcohol Use: No    Review of Systems  Constitutional: Positive for fever.  HENT: Positive for dental problem.     Allergies  Doxycycline hyclate; Benadryl; and Vicodin  Home Medications   Prior to Admission medications   Medication Sig Start Date End Date Taking? Authorizing Provider  Aspirin-Acetaminophen (GOODY BODY PAIN) 500-325 MG PACK Take 1 Package by mouth daily as needed (pain).   Yes Historical Provider, MD  ibuprofen (ADVIL,MOTRIN) 200 MG tablet Take 400 mg by mouth every 6 (six) hours as  needed for headache, mild pain or moderate pain.   Yes Historical Provider, MD  amoxicillin (AMOXIL) 500 MG capsule Take 1 capsule (500 mg total) by mouth 3 (three) times daily. 02/09/15   Dylan Madura, PA-C  HYDROcodone-acetaminophen (NORCO/VICODIN) 5-325 MG per tablet Take 1-2 tablets by mouth every 4 (four) hours as needed for moderate pain or severe pain. Patient not taking: Reported on 02/09/2015 06/30/14   Dylan Battiest, NP  metoprolol succinate (TOPROL XL) 50 MG 24 hr tablet Take 1 tablet (50 mg total) by mouth daily. Take with or immediately following a meal. Patient not taking: Reported on 02/09/2015 06/05/14   Dylan Salisbury, MD   BP 154/81 mmHg  Pulse 118  Temp(Src) 98.3 F (36.8 C) (Oral)  Resp 16  SpO2 96%   Physical Exam  Constitutional: He is oriented to person, place, and time. He appears well-developed and well-nourished. No distress.  Nontoxic/nonseptic appearing  HENT:  Head: Normocephalic and atraumatic.  Mouth/Throat: Uvula is midline, oropharynx is clear and moist and mucous membranes are normal. No trismus in the jaw. Abnormal dentition. Dental caries present. No dental abscesses.    No gingival fluctuance. No trismus or stridor. Patient tolerating secretions without difficulty. Multiple dental caries and cracked dentition with dental decay noted.  Eyes: Conjunctivae and EOM are normal. No scleral icterus.  Neck: Normal range of motion.  No nuchal rigidity or meningismus  Pulmonary/Chest: Effort normal. No respiratory distress.  Musculoskeletal: Normal range of motion.  Lymphadenopathy:    He has cervical adenopathy (mild).  Neurological: He is alert and oriented to person, place, and time. He exhibits normal muscle tone. Coordination normal.  Skin: Skin is warm and dry. No rash noted. He is not diaphoretic. No erythema. No pallor.  Psychiatric: He has a normal mood and affect. His behavior is normal.  Nursing note and vitals reviewed.   ED Course   Procedures (including critical care time)  DIAGNOSTIC STUDIES: Oxygen Saturation is 96% on RA, normal by my interpretation.    COORDINATION OF CARE: 8:14 PM - Discussed treatment plan with pt at bedside which includes ibuprofen, antibiotics, and dental referral, and pt agreed to plan.   MDM   Final diagnoses:  Dentalgia    Patient with toothache. No gross abscess. Exam unconcerning for Ludwig's angina or spread of infection. Will treat with amoxicillin and NSAIDs. Urged patient to follow-up with dentist. Resource guide and return precautions given. Patient agreeable to plan with no unaddressed concerns. Patient discharged in good condition.  I personally performed the services described in this documentation, which was scribed in my presence. The recorded information has been reviewed and is accurate.   Filed Vitals:   02/09/15 1932  BP: 154/81  Pulse: 118  Temp: 98.3 F (36.8 C)  TempSrc: Oral  Resp: 16  SpO2: 96%      Dylan Madura, PA-C 02/09/15 2036  Dylan Dibbles, MD 02/11/15 1218

## 2015-03-04 ENCOUNTER — Emergency Department (HOSPITAL_COMMUNITY)
Admission: EM | Admit: 2015-03-04 | Discharge: 2015-03-04 | Disposition: A | Discharge status: Home / Self Care | Payer: Self-pay | Attending: Emergency Medicine | Admitting: Emergency Medicine

## 2015-03-04 ENCOUNTER — Encounter (HOSPITAL_COMMUNITY): Payer: Self-pay | Admitting: Emergency Medicine

## 2015-03-04 DIAGNOSIS — Y939 Activity, unspecified: Secondary | ICD-10-CM | POA: Insufficient documentation

## 2015-03-04 DIAGNOSIS — Y9289 Other specified places as the place of occurrence of the external cause: Secondary | ICD-10-CM | POA: Insufficient documentation

## 2015-03-04 DIAGNOSIS — S46912A Strain of unspecified muscle, fascia and tendon at shoulder and upper arm level, left arm, initial encounter: Secondary | ICD-10-CM | POA: Insufficient documentation

## 2015-03-04 DIAGNOSIS — Z792 Long term (current) use of antibiotics: Secondary | ICD-10-CM | POA: Insufficient documentation

## 2015-03-04 DIAGNOSIS — Z720 Tobacco use: Secondary | ICD-10-CM | POA: Insufficient documentation

## 2015-03-04 DIAGNOSIS — Z7982 Long term (current) use of aspirin: Secondary | ICD-10-CM | POA: Insufficient documentation

## 2015-03-04 DIAGNOSIS — Z8659 Personal history of other mental and behavioral disorders: Secondary | ICD-10-CM | POA: Insufficient documentation

## 2015-03-04 DIAGNOSIS — I10 Essential (primary) hypertension: Secondary | ICD-10-CM | POA: Insufficient documentation

## 2015-03-04 DIAGNOSIS — I2510 Atherosclerotic heart disease of native coronary artery without angina pectoris: Secondary | ICD-10-CM | POA: Insufficient documentation

## 2015-03-04 DIAGNOSIS — Z8639 Personal history of other endocrine, nutritional and metabolic disease: Secondary | ICD-10-CM | POA: Insufficient documentation

## 2015-03-04 DIAGNOSIS — Y998 Other external cause status: Secondary | ICD-10-CM | POA: Insufficient documentation

## 2015-03-04 DIAGNOSIS — X58XXXA Exposure to other specified factors, initial encounter: Secondary | ICD-10-CM | POA: Insufficient documentation

## 2015-03-04 MED ORDER — CYCLOBENZAPRINE HCL 10 MG PO TABS: 10.0000 mg | ORAL_TABLET | Freq: Two times a day (BID) | ORAL | Status: DC | PRN

## 2015-03-04 MED ORDER — TRAMADOL HCL 50 MG PO TABS: 50.0000 mg | ORAL_TABLET | Freq: Four times a day (QID) | ORAL | Status: DC | PRN

## 2015-03-04 MED ORDER — MELOXICAM 15 MG PO TABS: 15.0000 mg | ORAL_TABLET | Freq: Every day | ORAL | Status: DC

## 2015-03-04 NOTE — ED Provider Notes (Signed)
CSN: [id number]     Arrival date & time 03/04/15  1114 History  This chart was scribed for Jaynie Crumble, PA-C, working with Rolan Bucco, MD by Chestine Spore, ED Scribe. The patient was seen in room TR09C/TR09C at 12:13 PM.    Chief Complaint  Patient presents with  . Shoulder Pain      The history is provided by the patient. No language interpreter was used.    HPI Comments: Dylan Houston is a 49 y.o. male who presents to the Emergency Department complaining of left shoulder pain onset 3 days ago. Pt reports that he was lifting a deep freezer and his left shoulder pain a couple hours after the incident. Pt denies fall. He denies numbness, tingling, neck pain, and any other symptoms. Pt had left shoulder exploratory surgery by Dr. Thurston Hole at [facility] and had multiple issues with his shoulder. 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   Past Medical History  Diagnosis Date  . Chest pain   . Hypercholesteremia   . Panic attack   . Coronary artery disease   . Hypertension    Past Surgical History  Procedure Laterality Date  . Cardiac catheterization     Family History  Problem Relation Age of Onset  . Heart disease Father   . Emphysema    . Cancer     History  Substance Use Topics  . Smoking status: Current Every Day Smoker -- 1.00 packs/day for 34 years    Types: Cigarettes  . Smokeless tobacco: Never Used  . Alcohol Use: No    Review of Systems  Musculoskeletal: Positive for arthralgias. Negative for joint swelling and neck pain.  Skin: Negative for color change, rash and wound.  Neurological: Negative for numbness.      Allergies  Doxycycline hyclate; Benadryl; and Vicodin  Home Medications   Prior to Admission medications   Medication Sig Start Date End Date Taking? Authorizing Provider  amoxicillin (AMOXIL) 500 MG capsule Take 1 capsule (500 mg total) by mouth 3 (three) times daily. 02/09/15   Antony Madura, PA-C  Aspirin-Acetaminophen (GOODY  BODY PAIN) 500-325 MG PACK Take 1 Package by mouth daily as needed (pain).    Historical Provider, MD  HYDROcodone-acetaminophen (NORCO/VICODIN) 5-325 MG per tablet Take 1-2 tablets by mouth every 4 (four) hours as needed for moderate pain or severe pain. Patient not taking: Reported on 02/09/2015 06/30/14   Harle Battiest, NP  ibuprofen (ADVIL,MOTRIN) 200 MG tablet Take 400 mg by mouth every 6 (six) hours as needed for headache, mild pain or moderate pain.    Historical Provider, MD  metoprolol succinate (TOPROL XL) 50 MG 24 hr tablet Take 1 tablet (50 mg total) by mouth daily. Take with or immediately following a meal. Patient not taking: Reported on 02/09/2015 06/05/14   Nelwyn Salisbury, MD   BP 131/82 mmHg  Pulse 107  Temp(Src) 97.3 F (36.3 C) (Oral)  Resp 17  Ht  (1.651 m)  Wt 210 lb (95.255 kg)  BMI 34.95 kg/m2  SpO2 99% Physical Exam  Constitutional: He is oriented to person, place, and time. He appears well-developed and well-nourished. No distress.  HENT:  Head: Normocephalic and atraumatic.  Eyes: EOM are normal.  Neck: Neck supple. No tracheal deviation present.  Cardiovascular: Normal rate.   Pulmonary/Chest: Effort normal. No respiratory distress.  Musculoskeletal: Normal range of motion.  Tender to palpation over left trapezius, left posterior shoulder joint. Pain with passive and active range of  motion in all directions of the shoulder. Negative arm drop test. Normal bicep and tricep strength. Distal radial pulse intact.  Neurological: He is alert and oriented to person, place, and time.  Skin: Skin is warm and dry.  Psychiatric: He has a normal mood and affect. His behavior is normal.  Nursing note and vitals reviewed.   ED Course  Procedures (including critical care time) DIAGNOSTIC STUDIES: Oxygen Saturation is 99% on RA, nl by my interpretation.    COORDINATION OF CARE: 12:16 PM-Discussed treatment plan which includes f/u to orthopedist, muscle relaxer  Rx, and pain medication Rx, with pt at bedside and pt agreed to plan.   Labs Review Labs Reviewed - No data to display  Imaging Review No results found.   EKG Interpretation None      MDM   Final diagnoses:  Shoulder strain, left, initial encounter    Patient with left shoulder pain, after lifting heavy object. Full range of motion passively and actively with some pain. He has history of rotator cuff tear on right shoulder with surgical repair by Theodis ShoveMurphy and [facility]. At this point I do not think patient needs an x-ray given no significant trauma. Plan to start on NSAIDs, sling, follow-up with patient's orthopedic specialist if pain is not improving. I also instructed him not to lift anything heavier 5 pounds for a week.   Filed Vitals:   03/04/15 1138 03/04/15 1230  BP: 131/82 141/93  Pulse: 107 94  Temp: 97.3 F (36.3 C) 97.9 F (36.6 C)  TempSrc: Oral Oral  Resp: 17 20  Height: 5\' 5"  (1.651 m)   Weight: 210 lb (95.255 kg)   SpO2: 99% 94%   I personally performed the services described in this documentation, which was scribed in my presence. The recorded information has been reviewed and is accurate.   Jaynie Crumbleatyana Halen [place], PA-C 03/04/15 1606  Rolan BuccoMelanie Belfi, MD 03/08/15 [phone]

## 2015-03-04 NOTE — ED Notes (Signed)
Pt states he was moving a large object, denies any pain while moving it, but a few hours later started feeling pain in his left shoulder. No obvious deformity or injury. Full ROM, pulses strong

## 2015-03-04 NOTE — Discharge Instructions (Signed)
mobic for pain and inflammation. Tramadol for severe pain. Flexeril for spasms. Ice and rest your shoulder. Follow up with Dr. Thurston Hole if pain continues after 3-5 days.   Shoulder Sprain A shoulder sprain is the result of damage to the tough, fiber-like tissues (ligaments) that help hold your shoulder in place. The ligaments may be stretched or torn. Besides the main shoulder joint (the ball and socket), there are several smaller joints that connect the bones in this area. A sprain usually involves one of those joints. Most often it is the acromioclavicular (or AC) joint. That is the joint that connects the collarbone (clavicle) and the shoulder blade (scapula) at the top point of the shoulder blade (acromion). A shoulder sprain is a mild form of what is called a shoulder separation. Recovering from a shoulder sprain may take some time. For some, pain lingers for several months. Most people recover without long term problems. CAUSES   A shoulder sprain is usually caused by some kind of trauma. This might be:  Falling on an outstretched arm.  Being hit hard on the shoulder.  Twisting the arm.  Shoulder sprains are more likely to occur in people who:  Play sports.  Have balance or coordination problems. SYMPTOMS   Pain when you move your shoulder.  Limited ability to move the shoulder.  Swelling and tenderness on top of the shoulder.  Redness or warmth in the shoulder.  Bruising.  A change in the shape of the shoulder. DIAGNOSIS  Your healthcare provider may:  Ask about your symptoms.  Ask about recent activity that might have caused those symptoms.  Examine your shoulder. You may be asked to do simple exercises to test movement. The other shoulder will be examined for comparison.  Order some tests that provide a look inside the body. They can show the extent of the injury. The tests could include:  X-rays.  CT (computed tomography) scan.  MRI (magnetic resonance imaging)  scan. RISKS AND COMPLICATIONS  Loss of full shoulder motion.  Ongoing shoulder pain. TREATMENT  How long it takes to recover from a shoulder sprain depends on how severe it was. Treatment options may include:  Rest. You should not use the arm or shoulder until it heals.  Ice. For 2 or 3 days after the injury, put an ice pack on the shoulder up to 4 times a day. It should stay on for 15 to 20 minutes each time. Wrap the ice in a towel so it does not touch your skin.  Over-the-counter medicine to relieve pain.  A sling or brace. This will keep the arm still while the shoulder is healing.  Physical therapy or rehabilitation exercises. These will help you regain strength and motion. Ask your healthcare provider when it is OK to begin these exercises.  Surgery. The need for surgery is rare with a sprained shoulder, but some people may need surgery to keep the joint in place and reduce pain. HOME CARE INSTRUCTIONS   Ask your healthcare provider about what you should and should not do while your shoulder heals.  Make sure you know how to apply ice to the correct area of your shoulder.  Talk with your healthcare provider about which medications should be used for pain and swelling.  If rehabilitation therapy will be needed, ask your healthcare provider to refer you to a therapist. If it is not recommended, then ask about at-home exercises. Find out when exercise should begin. SEEK MEDICAL CARE IF:  Your pain,  swelling, or redness at the joint increases. SEEK IMMEDIATE MEDICAL CARE IF:   You have a fever.  You cannot move your arm or shoulder. Document Released: 12/27/2008 Document Revised: 11/02/2011 Document Reviewed: 12/27/2008 [facility] Patient Information 2015 [employer] [place]. This information is not intended to replace advice given to you by your health care provider. Make sure you discuss any questions you have with your health care provider.

## 2015-03-11 ENCOUNTER — Ambulatory Visit (INDEPENDENT_AMBULATORY_CARE_PROVIDER_SITE_OTHER): Payer: Self-pay | Admitting: Family Medicine

## 2015-03-11 ENCOUNTER — Encounter: Payer: Self-pay | Admitting: Family Medicine

## 2015-03-11 VITALS — BP 121/66 | HR 106 | Temp 98.5°F | Ht 65.0 in | Wt 218.0 lb

## 2015-03-11 DIAGNOSIS — M25512 Pain in left shoulder: Secondary | ICD-10-CM

## 2015-03-11 MED ORDER — PREDNISONE 10 MG PO TABS: ORAL_TABLET | ORAL | Status: DC

## 2015-03-11 NOTE — Progress Notes (Signed)
   Subjective:    Patient ID: Dylan Houston, male    DOB: 1965/10/23, 49 y.o.   MRN: [id number]  HPI Here to follow up an ER visit on 03-04-15 for left neck and shoulder pain that had started several days prior to that. No recent trauma. He was given anti-inflammatory meds and he as been using ice. The shoulder still causes pain when he moves it. He has been trying to work with it.   Review of Systems  Constitutional: Negative.   Musculoskeletal: Positive for arthralgias. Negative for joint swelling.       Objective:   Physical Exam  Constitutional: He appears well-developed and well-nourished.  Musculoskeletal:  The left shoulder is not tender but ROM is limited by pain. No crepitus.           Assessment & Plan:  Possible rotator cuff tendonitis. Given a steroid taper. Recheck prn

## 2015-03-11 NOTE — Progress Notes (Signed)
Pre visit review using our clinic review tool, if applicable. No additional management support is needed unless otherwise documented below in the visit note. 

## 2015-03-13 ENCOUNTER — Encounter: Payer: Self-pay | Admitting: Family Medicine

## 2015-05-29 ENCOUNTER — Ambulatory Visit (INDEPENDENT_AMBULATORY_CARE_PROVIDER_SITE_OTHER): Payer: Self-pay | Admitting: Family Medicine

## 2015-05-29 ENCOUNTER — Encounter: Payer: Self-pay | Admitting: Family Medicine

## 2015-05-29 VITALS — BP 123/79 | HR 99 | Temp 98.4°F | Ht 65.0 in | Wt 215.0 lb

## 2015-05-29 DIAGNOSIS — A084 Viral intestinal infection, unspecified: Secondary | ICD-10-CM

## 2015-05-29 NOTE — Progress Notes (Signed)
Pre visit review using our clinic review tool, if applicable. No additional management support is needed unless otherwise documented below in the visit note. 

## 2015-05-29 NOTE — Progress Notes (Signed)
   Subjective:    Patient ID: Dylan Houston, male    DOB: [birth year], 49 y.o.   MRN: [id number]  HPI Here for 3 days of weakness, body aches, nausea and vomiting, and diarrhea. He had fever the first day but not since. Drinking fluids.    Review of Systems  Constitutional: Positive for fever and fatigue.  HENT: Negative.   Eyes: Negative.   Respiratory: Negative.   Cardiovascular: Negative.   Gastrointestinal: Positive for nausea, vomiting, abdominal pain and diarrhea. Negative for constipation, blood in stool, abdominal distention and anal bleeding.  Genitourinary: Negative.        Objective:   Physical Exam  Constitutional: He appears well-developed and well-nourished. No distress.  Cardiovascular: Normal rate, regular rhythm, normal heart sounds and intact distal pulses.   Pulmonary/Chest: Effort normal and breath sounds normal.  Abdominal: Soft. Bowel sounds are normal. He exhibits no distension and no mass. There is no rebound and no guarding.  Mild diffuse tenderness          Assessment & Plan:  Viral enteritis. Drink fluids, use Imodium prn. Written out of work from 05-27-15 until 06-03-15.

## 2015-06-05 ENCOUNTER — Encounter: Payer: Self-pay | Admitting: Family Medicine

## 2015-06-05 ENCOUNTER — Ambulatory Visit (INDEPENDENT_AMBULATORY_CARE_PROVIDER_SITE_OTHER): Payer: Self-pay | Admitting: Family Medicine

## 2015-06-05 VITALS — BP 115/73 | HR 108 | Temp 99.1°F | Ht 65.0 in | Wt 213.0 lb

## 2015-06-05 DIAGNOSIS — J019 Acute sinusitis, unspecified: Secondary | ICD-10-CM

## 2015-06-05 MED ORDER — AMOXICILLIN-POT CLAVULANATE 875-125 MG PO TABS: 1.0000 | ORAL_TABLET | Freq: Two times a day (BID) | ORAL | Status: DC

## 2015-06-05 NOTE — Progress Notes (Signed)
Pre visit review using our clinic review tool, if applicable. No additional management support is needed unless otherwise documented below in the visit note. 

## 2015-06-05 NOTE — Progress Notes (Signed)
   Subjective:    Patient ID: Dylan Houston, male    DOB: March 04, 1966, 49 y.o.   MRN: [id number]  HPI Here for 3 days of sinus pressure, PND, ST, and coughing up yellow sputum. No fever.    Review of Systems  Constitutional: Negative.   HENT: Positive for congestion, postnasal drip and sinus pressure.   Eyes: Negative.   Respiratory: Positive for cough.        Objective:   Physical Exam  Constitutional: He appears well-developed and well-nourished.  HENT:  Right Ear: External ear normal.  Left Ear: External ear normal.  Nose: Nose normal.  Mouth/Throat: Oropharynx is clear and moist.  Eyes: Conjunctivae are normal.  Neck: No thyromegaly present.  Pulmonary/Chest: Effort normal and breath sounds normal.  Lymphadenopathy:    He has no cervical adenopathy.          Assessment & Plan:  Sinusitis, treat with Augmentin. Written out of work from 06-03-15 until 06-07-15.

## 2015-10-10 ENCOUNTER — Encounter: Payer: Self-pay | Admitting: Family Medicine

## 2015-10-10 ENCOUNTER — Ambulatory Visit (INDEPENDENT_AMBULATORY_CARE_PROVIDER_SITE_OTHER): Payer: Self-pay | Admitting: Family Medicine

## 2015-10-10 VITALS — BP 126/84 | HR 108 | Temp 98.7°F | Ht 65.0 in | Wt 210.0 lb

## 2015-10-10 DIAGNOSIS — F17200 Nicotine dependence, unspecified, uncomplicated: Secondary | ICD-10-CM | POA: Insufficient documentation

## 2015-10-10 DIAGNOSIS — J019 Acute sinusitis, unspecified: Secondary | ICD-10-CM

## 2015-10-10 DIAGNOSIS — F17210 Nicotine dependence, cigarettes, uncomplicated: Secondary | ICD-10-CM

## 2015-10-10 DIAGNOSIS — G4733 Obstructive sleep apnea (adult) (pediatric): Secondary | ICD-10-CM

## 2015-10-10 DIAGNOSIS — J439 Emphysema, unspecified: Secondary | ICD-10-CM | POA: Insufficient documentation

## 2015-10-10 MED ORDER — HYDROCODONE-HOMATROPINE 5-1.5 MG/5ML PO SYRP: 5.0000 mL | ORAL_SOLUTION | ORAL | Status: DC | PRN

## 2015-10-10 MED ORDER — AMOXICILLIN-POT CLAVULANATE 875-125 MG PO TABS: 1.0000 | ORAL_TABLET | Freq: Two times a day (BID) | ORAL | Status: DC

## 2015-10-10 MED ORDER — ALBUTEROL SULFATE HFA 108 (90 BASE) MCG/ACT IN AERS
2.0000 | INHALATION_SPRAY | RESPIRATORY_TRACT | Status: DC | PRN
Start: 2015-10-10 — End: 2016-08-24

## 2015-10-10 NOTE — Progress Notes (Signed)
Pre visit review using our clinic review tool, if applicable. No additional management support is needed unless otherwise documented below in the visit note. 

## 2015-10-10 NOTE — Progress Notes (Signed)
   Subjective:    Patient ID: Dylan Houston, male    DOB: Jan 02, 1966, 50 y.o.   MRN: [id number]  HPI Here for 5 days of sinus pressure, PND, and coughing up yellow sputum. No fever.  He also asks about replacing his broken CPAP machine. He had seen Dr. Shelle Iron for this in the past. He continues to smoke one ppd. I have repeatedly urged him to quit over the years but he has been resistant to this.   Review of Systems  Constitutional: Negative.   HENT: Positive for congestion, postnasal drip and sinus pressure. Negative for sore throat.   Eyes: Negative.   Respiratory: Positive for cough, chest tightness and shortness of breath. Negative for wheezing.   Cardiovascular: Negative.        Objective:   Physical Exam  Constitutional: He appears well-developed and well-nourished.  HENT:  Right Ear: External ear normal.  Left Ear: External ear normal.  Nose: Nose normal.  Mouth/Throat: Oropharynx is clear and moist.  Eyes: Conjunctivae are normal.  Neck: No thyromegaly present.  Cardiovascular: Normal rate, regular rhythm, normal heart sounds and intact distal pulses.   Pulmonary/Chest: Effort normal. He has no wheezes. He has no rales.  Clear with very quiet breath sounds   Lymphadenopathy:    He has no cervical adenopathy.          Assessment & Plan:  Sinusitis, treat with Augmentin. Use an inhaler prn. Refer to a new sleep apnea specialist. I again urged him to quit smoking. I believe he has some emphysema now.  I mentioned OTC nicotine patches a swell as Chantix. He will ask his pharmacist what the out of pocket cost would be for Chantix.

## 2015-11-12 ENCOUNTER — Encounter: Payer: Self-pay | Admitting: Family Medicine

## 2015-11-12 ENCOUNTER — Ambulatory Visit (INDEPENDENT_AMBULATORY_CARE_PROVIDER_SITE_OTHER): Payer: Self-pay | Admitting: Family Medicine

## 2015-11-12 VITALS — BP 140/90 | HR 117 | Temp 98.0°F | Wt 211.0 lb

## 2015-11-12 DIAGNOSIS — J019 Acute sinusitis, unspecified: Secondary | ICD-10-CM

## 2015-11-12 MED ORDER — LEVOFLOXACIN 500 MG PO TABS: 500.0000 mg | ORAL_TABLET | Freq: Every day | ORAL | Status: AC

## 2015-11-12 MED ORDER — HYDROCODONE-HOMATROPINE 5-1.5 MG/5ML PO SYRP: 5.0000 mL | ORAL_SOLUTION | ORAL | Status: DC | PRN

## 2015-11-12 NOTE — Progress Notes (Signed)
   Subjective:    Patient ID: Dylan Houston, male    DOB: 01/15/1966, 50 y.o.   MRN: [id number]  HPI Here with one month of sinus congestion, PND, and coughing up yellow sputum. He took a course of Augmentin with only partial results.    Review of Systems  Constitutional: Negative.   HENT: Positive for congestion, postnasal drip and sinus pressure. Negative for sore throat.   Eyes: Negative.   Respiratory: Positive for cough.        Objective:   Physical Exam  Constitutional: He appears well-developed and well-nourished.  HENT:  Right Ear: External ear normal.  Left Ear: External ear normal.  Nose: Nose normal.  Mouth/Throat: Oropharynx is clear and moist.  Eyes: Conjunctivae are normal.  Neck: No thyromegaly present.  Pulmonary/Chest: Effort normal and breath sounds normal. No respiratory distress. He has no wheezes. He has no rales.  Lymphadenopathy:    He has no cervical adenopathy.          Assessment & Plan:  Partially treated sinusitis, given Levaquin

## 2015-11-12 NOTE — Progress Notes (Signed)
Pre visit review using our clinic review tool, if applicable. No additional management support is needed unless otherwise documented below in the visit note. 

## 2015-11-30 ENCOUNTER — Emergency Department (HOSPITAL_COMMUNITY)
Admission: EM | Admit: 2015-11-30 | Discharge: 2015-11-30 | Disposition: A | Discharge status: Home / Self Care | Payer: Self-pay | Attending: Emergency Medicine | Admitting: Emergency Medicine

## 2015-11-30 ENCOUNTER — Encounter (HOSPITAL_COMMUNITY): Payer: Self-pay | Admitting: Emergency Medicine

## 2015-11-30 DIAGNOSIS — Z9889 Other specified postprocedural states: Secondary | ICD-10-CM | POA: Insufficient documentation

## 2015-11-30 DIAGNOSIS — I2510 Atherosclerotic heart disease of native coronary artery without angina pectoris: Secondary | ICD-10-CM | POA: Insufficient documentation

## 2015-11-30 DIAGNOSIS — Z8659 Personal history of other mental and behavioral disorders: Secondary | ICD-10-CM | POA: Insufficient documentation

## 2015-11-30 DIAGNOSIS — J040 Acute laryngitis: Secondary | ICD-10-CM | POA: Insufficient documentation

## 2015-11-30 DIAGNOSIS — Z79899 Other long term (current) drug therapy: Secondary | ICD-10-CM | POA: Insufficient documentation

## 2015-11-30 DIAGNOSIS — I10 Essential (primary) hypertension: Secondary | ICD-10-CM | POA: Insufficient documentation

## 2015-11-30 DIAGNOSIS — E669 Obesity, unspecified: Secondary | ICD-10-CM | POA: Insufficient documentation

## 2015-11-30 DIAGNOSIS — F17210 Nicotine dependence, cigarettes, uncomplicated: Secondary | ICD-10-CM | POA: Insufficient documentation

## 2015-11-30 DIAGNOSIS — H73892 Other specified disorders of tympanic membrane, left ear: Secondary | ICD-10-CM | POA: Insufficient documentation

## 2015-11-30 NOTE — Discharge Instructions (Signed)
Laryngitis You have had ongoing laryngitis for several months that has not improved with treatment. At this point, you will need to see an Ear, Nose, Throat specialist for further evaluation to make sure that you do not have throat cancer or other serious condition. Laryngitis is inflammation of your vocal cords. This causes hoarseness, coughing, loss of voice, sore throat, or a dry throat. Your vocal cords are two bands of muscles that are found in your throat. When you speak, these cords come together and vibrate. These vibrations come out through your mouth as sound. When your vocal cords are inflamed, your voice sounds different. Laryngitis can be temporary (acute) or long-term (chronic). Most cases of acute laryngitis improve with time. Chronic laryngitis is laryngitis that lasts for more than three weeks. CAUSES Acute laryngitis may be caused by:  A viral infection.  Lots of talking, yelling, or singing. This is also called vocal strain.  Bacterial infections. Chronic laryngitis may be caused by:  Vocal strain.  Injury to your vocal cords.  Acid reflux (gastroesophageal reflux disease or GERD).  Allergies.  Sinus infection.  Smoking.  Alcohol abuse.  Breathing in chemicals or dust.  Growths on the vocal cords. RISK FACTORS Risk factors for laryngitis include:  Smoking.  Alcohol abuse.  Having allergies. SIGNS AND SYMPTOMS Symptoms of laryngitis may include:  Low, hoarse voice.  Loss of voice.  Dry cough.  Sore throat.  Stuffy nose. DIAGNOSIS Laryngitis may be diagnosed by:  Physical exam.  Throat culture.  Blood test.  Laryngoscopy. This procedure allows your health care provider to look at your vocal cords with a mirror or viewing tube. TREATMENT Treatment for laryngitis depends on what is causing it. Usually, treatment involves resting your voice and using medicines to soothe your throat. However, if your laryngitis is caused by a bacterial  infection, you may need to take antibiotic medicine. If your laryngitis is caused by a growth, you may need to have a procedure to remove it. HOME CARE INSTRUCTIONS  Drink enough fluid to keep your urine clear or pale yellow.  Breathe in moist air. Use a humidifier if you live in a dry climate.  Take medicines only as directed by your health care provider.  If you were prescribed an antibiotic medicine, finish it all even if you start to feel better.  Do not smoke cigarettes or electronic cigarettes. If you need help quitting, ask your health care provider.  Talk as little as possible. Also avoid whispering, which can cause vocal strain.  Write instead of talking. Do this until your voice is back to normal. SEEK MEDICAL CARE IF:  You have a fever.  You have increasing pain.  You have difficulty swallowing. SEEK IMMEDIATE MEDICAL CARE IF:  You cough up blood.  You have trouble breathing.   This information is not intended to replace advice given to you by your health care provider. Make sure you discuss any questions you have with your health care provider.   Document Released: 08/10/2005 Document Revised: 08/31/2014 Document Reviewed: 01/23/2014 Elsevier Interactive Patient Education [employer] Inc.

## 2015-11-30 NOTE — ED Provider Notes (Signed)
CSN: [id number]     Arrival date & time 11/30/15  1754 History   First MD Initiated Contact with Patient 11/30/15 1809     Chief Complaint  Patient presents with  . Hoarse Voice      (Consider location/radiation/quality/duration/timing/severity/associated sxs/prior Treatment) HPI Patient has had 2 months of throat hoarseness despite multiple treatments with antibiotics and decongestants. He has no associated fever, no pain, no difficulty with breathing or swallowing. He is presenting tonight because he is concerned that it has not improved despite treatment. He reports it is getting difficult to do his job appropriately because he is a Garment/textile technologistcall center manager. He does continue to smoke approximately half pack per day. His father died of lung cancer. Past Medical History  Diagnosis Date  . Chest pain   . Hypercholesteremia   . Panic attack   . Coronary artery disease   . Hypertension    Past Surgical History  Procedure Laterality Date  . Cardiac catheterization    . Rotator cuff repair Left 1998    per [facility]   . Knee arthroscopy w/ meniscal repair Left 1995    per [facility]    Family History  Problem Relation Age of Onset  . Heart disease Father   . Emphysema    . Cancer     Social History  Substance Use Topics  . Smoking status: Current Every Day Smoker -- 1.00 packs/day for 34 years    Types: Cigarettes  . Smokeless tobacco: Never Used  . Alcohol Use: No    Review of Systems 10 Systems reviewed and are negative for acute change except as noted in the HPI.    Allergies  Doxycycline hyclate and Benadryl  Home Medications   Prior to Admission medications   Medication Sig Start Date End Date Taking? Authorizing Provider  albuterol (PROVENTIL HFA;VENTOLIN HFA) 108 (90 Base) MCG/ACT inhaler Inhale 2 puffs into the lungs every 4 (four) hours as needed for wheezing or shortness of breath. 10/10/15   Nelwyn SalisburyStephen A Fry, MD  Aspirin-Acetaminophen (GOODY BODY PAIN)  500-325 MG PACK Take 1 Package by mouth daily as needed (pain). Reported on 10/10/2015    Historical Provider, MD  HYDROcodone-homatropine (HYDROMET) 5-1.5 MG/5ML syrup Take 5 mLs by mouth every 4 (four) hours as needed. 11/12/15   Nelwyn SalisburyStephen A Fry, MD   BP 123/88 mmHg  Pulse 110  Temp(Src) 97.5 F (36.4 C) (Oral)  Resp 20  SpO2 98% Physical Exam  Constitutional: He is oriented to person, place, and time.  Mild obesity. Patient is alert and nontoxic. No distress.  HENT:  Head: Normocephalic and atraumatic.  Right TM normal. Left TM mild serous effusion. No erythema or bulging. Nares are patent. Posterior oropharynx shows some diffuse erythema of the mucosal tissues. No exudate and no surgical lesions. Posterior airway is widely patent. Several areas of poor dentition with active decay. No active drainage or discharge or associated facial swelling.  Neck: Neck supple.  No cervical lymphadenopathy. No stridor. No palpable neck masses.  Cardiovascular: Normal rate, regular rhythm, normal heart sounds and intact distal pulses.   Pulmonary/Chest: Effort normal and breath sounds normal.  Musculoskeletal: Normal range of motion.  Neurological: He is alert and oriented to person, place, and time. Coordination normal.  Skin: Skin is warm and dry.  Psychiatric: He has a normal mood and affect.    ED Course  Procedures (including critical care time) Labs Review Labs Reviewed - No data to display  Imaging Review No results found.  I have personally reviewed and evaluated these images and lab results as part of my medical decision-making.   EKG Interpretation None      MDM   Final diagnoses:  Laryngitis   Patient is counseled that with persisting laryngitis despite 2 months of treatment, he must follow up with ENT to make sure he does not have a serious condition such as throat cancer. Patient is alert and nontoxic. He has no respiratory distress. There is no associated stridor or dysphagia.  Patient is safe to follow-up with ENT which has been stressed as very important.    Arby Barrette, MD 11/30/15 1900

## 2015-11-30 NOTE — ED Notes (Signed)
Pt reports treated for URI 2 months ago; symptoms better but reports ongoing hoarseness; denies pain.

## 2015-12-10 ENCOUNTER — Ambulatory Visit: Payer: Self-pay | Admitting: Family Medicine

## 2015-12-12 ENCOUNTER — Ambulatory Visit: Payer: Self-pay | Admitting: Family Medicine

## 2016-01-28 ENCOUNTER — Ambulatory Visit (INDEPENDENT_AMBULATORY_CARE_PROVIDER_SITE_OTHER): Payer: Self-pay | Admitting: Pulmonary Disease

## 2016-01-28 ENCOUNTER — Encounter: Payer: Self-pay | Admitting: Pulmonary Disease

## 2016-01-28 VITALS — BP 150/82 | HR 100 | Ht 69.0 in | Wt 205.0 lb

## 2016-01-28 DIAGNOSIS — J439 Emphysema, unspecified: Secondary | ICD-10-CM

## 2016-01-28 DIAGNOSIS — G4733 Obstructive sleep apnea (adult) (pediatric): Secondary | ICD-10-CM

## 2016-01-28 NOTE — Patient Instructions (Addendum)
Rx for autoCPAP 10-15 will be sent-hopefully we can get you a CPAP machine through charity  We discussed cutting down smoking and methods to do this including not smoking in the house, and decreasing by one to 2 cigarettes every week Once you are down to 1 pack per day, we can consider Wellbutrin tablets Which would also help with your nerves  Chest x-ray today

## 2016-01-28 NOTE — Assessment & Plan Note (Signed)
Rx for autoCPAP 10-15 will be sent-hopefully we can get you a CPAP machine through charity

## 2016-01-28 NOTE — Assessment & Plan Note (Signed)
Chest x-ray today We'll focus on smoking cessation rather than doing a spirometry today to avoid costs

## 2016-01-28 NOTE — Assessment & Plan Note (Signed)
We discussed cutting down smoking and methods to do this including not smoking in the house, and decreasing by one to 2 cigarettes every week Once you are down to 1 pack per day, we can consider Wellbutrin tablets Which would also help with your nerves

## 2016-01-28 NOTE — Progress Notes (Signed)
Subjective:    Patient ID: Dylan Houston, male    DOB: 12-27-1965, 50 y.o.   MRN: [id number]  HPI Chief Complaint  Patient presents with  . Follow-up    former [facility] patient, needs new CPAP machine,  Sleep study done [id number].      50 year old smoker presents to reestablish care for severe OSA. He was last seen more than 3 years ago  PSG in 2007 showed AHI of 74/hour. He does not have insurance will obtain a CPAP and now he states that this has burnt out. Epworth sleepiness score is 7 Bedtime is 10 PM, sleep latency is 30 minutes, he sleeps on his back with 2 pillows. He reports 4-6 nocturnal awakenings and then has to go out to smoke he is finally out of bed at 7 AM feeling tired with occasional dryness of mouth and headaches. He is gained about 10 pounds in the last 3 years  He continues to smoke about 2 packs per day. 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 He reports dyspnea on exertion, he had an episode of bronchitis in 08/2015 and received 2 rounds of antibiotics and prednisone to clear this he also had an emergency room visit. He had persistent hoarseness and dry cough and was given ENT appointment which she has not kept  Significant tests/ events  NPSG 2007:  AHI 74/hr with desat 60% Auto 2010: optimal pressure 13-14cm.    Past Medical History  Diagnosis Date  . Chest pain   . Hypercholesteremia   . Panic attack   . Coronary artery disease   . Hypertension     Past Surgical History  Procedure Laterality Date  . Cardiac catheterization    . Rotator cuff repair Left 1998    per [facility]   . Knee arthroscopy w/ meniscal repair Left 1995    per [facility]     Allergies  Allergen Reactions  . Doxycycline Hyclate Diarrhea and Nausea And Vomiting  . Benadryl [Diphenhydramine Hcl] Anxiety    Social History   Social History  . Marital Status: Single    Spouse Name: N/A  . Number of Children: N/A  . Years of Education: N/A   Occupational History  .  customer service    Social History Main Topics  . Smoking status: Current Every Day Smoker -- 1.00 packs/day for 34 years    Types: Cigarettes  . Smokeless tobacco: Never Used  . Alcohol Use: No  . Drug Use: No  . Sexual Activity: Not on file   Other Topics Concern  . Not on file   Social History Narrative     Family History  Problem Relation Age of Onset  . Heart disease Father   . Emphysema    . Cancer       Review of Systems neg for any significant sore throat, dysphagia, itching, sneezing, nasal congestion or excess/ purulent secretions, fever, chills, sweats, unintended wt loss, pleuritic or exertional cp, hempoptysis, orthopnea pnd or change in chronic leg swelling.  Also denies presyncope, palpitations, heartburn, abdominal pain, nausea, vomiting, diarrhea or change in bowel or urinary habits, dysuria,hematuria, rash, arthralgias, visual complaints, headache, numbness weakness or ataxia.     Objective:   Physical Exam   Gen. Pleasant, well-nourished, in no distress ENT - no lesions, no post nasal drip Neck: No JVD, no thyromegaly, no carotid bruits Lungs: no use of accessory muscles, no dullness to percussion, decreased without rales or rhonchi  Cardiovascular: Rhythm regular, heart  sounds  normal, no murmurs or gallops, no peripheral edema Musculoskeletal: No deformities, no cyanosis or clubbing         Assessment & Plan:

## 2016-06-17 ENCOUNTER — Telehealth: Payer: Self-pay | Admitting: Family Medicine

## 2016-06-17 NOTE — Telephone Encounter (Signed)
Pt said back in march 2017. Dr fry said he would accept his friend angel brown. Can I sch?

## 2016-06-18 NOTE — Telephone Encounter (Signed)
See below , thanks

## 2016-06-18 NOTE — Telephone Encounter (Signed)
Yes I can see them thanks  

## 2016-06-19 NOTE — Telephone Encounter (Signed)
lmom for pt to call back

## 2016-06-24 NOTE — Telephone Encounter (Signed)
Pt will call back

## 2016-07-29 ENCOUNTER — Ambulatory Visit (INDEPENDENT_AMBULATORY_CARE_PROVIDER_SITE_OTHER): Payer: Self-pay | Admitting: Family Medicine

## 2016-07-29 ENCOUNTER — Encounter: Payer: Self-pay | Admitting: Family Medicine

## 2016-07-29 VITALS — BP 121/76 | HR 107 | Temp 98.4°F | Ht 69.0 in | Wt 169.0 lb

## 2016-07-29 DIAGNOSIS — H6592 Unspecified nonsuppurative otitis media, left ear: Secondary | ICD-10-CM

## 2016-07-29 MED ORDER — OFLOXACIN 0.3 % OT SOLN: 5.0000 [drp] | Freq: Two times a day (BID) | OTIC | 0 refills | Status: DC

## 2016-07-29 MED ORDER — CEPHALEXIN 500 MG PO CAPS: 500.0000 mg | ORAL_CAPSULE | Freq: Four times a day (QID) | ORAL | 0 refills | Status: AC

## 2016-07-29 NOTE — Progress Notes (Signed)
Pre visit review using our clinic review tool, if applicable. No additional management support is needed unless otherwise documented below in the visit note. 

## 2016-07-29 NOTE — Progress Notes (Signed)
   Subjective:    Patient ID: Dylan Houston, male    DOB: 1966/03/17, 50 y.o.   MRN: [id number]  HPI Here for 3 days of pain in the left ear. This started with stuffy head, PND, and a dry cough but these symptoms resolved as the ear pain began. No fever.    Review of Systems  Constitutional: Negative.   HENT: Positive for ear pain. Negative for congestion, ear discharge, postnasal drip, sinus pain and sore throat.   Eyes: Negative.   Respiratory: Negative.        Objective:   Physical Exam  Constitutional: Dylan Houston appears well-developed and well-nourished.  HENT:  Right Ear: External ear normal.  Nose: Nose normal.  Mouth/Throat: Oropharynx is clear and moist.  Left TM is red and dull   Eyes: Conjunctivae are normal.  Neck: No thyromegaly present.  Pulmonary/Chest: Effort normal and breath sounds normal.  Lymphadenopathy:    Dylan Houston has no cervical adenopathy.          Assessment & Plan:  Otitis media, treat with Keflex.  Nelwyn SalisburyFRY,STEPHEN A, MD

## 2016-08-22 ENCOUNTER — Encounter (HOSPITAL_COMMUNITY): Payer: Self-pay | Admitting: Nurse Practitioner

## 2016-08-22 ENCOUNTER — Inpatient Hospital Stay (HOSPITAL_COMMUNITY)
Admission: EM | Admit: 2016-08-22 | Discharge: 2016-08-24 | DRG: 313 | Disposition: A | Discharge status: Home / Self Care | Payer: Self-pay | Attending: Internal Medicine | Admitting: Internal Medicine

## 2016-08-22 ENCOUNTER — Emergency Department (HOSPITAL_COMMUNITY): Payer: Self-pay

## 2016-08-22 DIAGNOSIS — B369 Superficial mycosis, unspecified: Secondary | ICD-10-CM

## 2016-08-22 DIAGNOSIS — L02214 Cutaneous abscess of groin: Secondary | ICD-10-CM | POA: Diagnosis present

## 2016-08-22 DIAGNOSIS — Z8249 Family history of ischemic heart disease and other diseases of the circulatory system: Secondary | ICD-10-CM

## 2016-08-22 DIAGNOSIS — F17200 Nicotine dependence, unspecified, uncomplicated: Secondary | ICD-10-CM | POA: Diagnosis present

## 2016-08-22 DIAGNOSIS — R002 Palpitations: Secondary | ICD-10-CM | POA: Diagnosis present

## 2016-08-22 DIAGNOSIS — L02224 Furuncle of groin: Secondary | ICD-10-CM | POA: Diagnosis present

## 2016-08-22 DIAGNOSIS — Z720 Tobacco use: Secondary | ICD-10-CM | POA: Diagnosis present

## 2016-08-22 DIAGNOSIS — F17210 Nicotine dependence, cigarettes, uncomplicated: Secondary | ICD-10-CM | POA: Diagnosis present

## 2016-08-22 DIAGNOSIS — I2510 Atherosclerotic heart disease of native coronary artery without angina pectoris: Secondary | ICD-10-CM | POA: Diagnosis present

## 2016-08-22 DIAGNOSIS — Z881 Allergy status to other antibiotic agents status: Secondary | ICD-10-CM

## 2016-08-22 DIAGNOSIS — R0602 Shortness of breath: Secondary | ICD-10-CM | POA: Diagnosis present

## 2016-08-22 DIAGNOSIS — Z836 Family history of other diseases of the respiratory system: Secondary | ICD-10-CM

## 2016-08-22 DIAGNOSIS — E7800 Pure hypercholesterolemia, unspecified: Secondary | ICD-10-CM | POA: Diagnosis present

## 2016-08-22 DIAGNOSIS — R079 Chest pain, unspecified: Principal | ICD-10-CM | POA: Diagnosis present

## 2016-08-22 LAB — CBC WITH DIFFERENTIAL/PLATELET
Basophils Absolute: 0 10*3/uL (ref 0.0–0.1)
Basophils Relative: 0 %
Eosinophils Absolute: 0.1 10*3/uL (ref 0.0–0.7)
Eosinophils Relative: 1 %
HEMATOCRIT: 45.4 % (ref 39.0–52.0)
HEMOGLOBIN: 15 g/dL (ref 13.0–17.0)
LYMPHS PCT: 19 %
Lymphs Abs: 2.3 10*3/uL (ref 0.7–4.0)
MCH: 29.2 pg (ref 26.0–34.0)
MCHC: 33 g/dL (ref 30.0–36.0)
MCV: 88.5 fL (ref 78.0–100.0)
MONOS PCT: 6 %
Monocytes Absolute: 0.7 10*3/uL (ref 0.1–1.0)
NEUTROS ABS: 9.5 10*3/uL — AB (ref 1.7–7.7)
Neutrophils Relative %: 74 %
Platelets: 272 10*3/uL (ref 150–400)
RBC: 5.13 MIL/uL (ref 4.22–5.81)
RDW: 13.9 % (ref 11.5–15.5)
WBC: 12.6 10*3/uL — ABNORMAL HIGH (ref 4.0–10.5)

## 2016-08-22 LAB — COMPREHENSIVE METABOLIC PANEL
ALT: 11 U/L — ABNORMAL LOW (ref 17–63)
AST: 17 U/L (ref 15–41)
Albumin: 4.3 g/dL (ref 3.5–5.0)
Alkaline Phosphatase: 102 U/L (ref 38–126)
Anion gap: 13 (ref 5–15)
BILIRUBIN TOTAL: 0.4 mg/dL (ref 0.3–1.2)
BUN: 11 mg/dL (ref 6–20)
CHLORIDE: 102 mmol/L (ref 101–111)
CO2: 24 mmol/L (ref 22–32)
Calcium: 9 mg/dL (ref 8.9–10.3)
Creatinine, Ser: 0.67 mg/dL (ref 0.61–1.24)
GFR calc Af Amer: >60 mL/min (ref >60)
GFR calc non Af Amer: >60 mL/min (ref >60)
GLUCOSE: 96 mg/dL (ref 65–99)
POTASSIUM: 3.4 mmol/L — AB (ref 3.5–5.1)
SODIUM: 139 mmol/L (ref 135–145)
TOTAL PROTEIN: 8.2 g/dL — AB (ref 6.5–8.1)

## 2016-08-22 LAB — RAPID URINE DRUG SCREEN, HOSP PERFORMED
Amphetamines: NOT DETECTED
BENZODIAZEPINES: NOT DETECTED
Barbiturates: NOT DETECTED
COCAINE: NOT DETECTED
OPIATES: NOT DETECTED
TETRAHYDROCANNABINOL: NOT DETECTED

## 2016-08-22 LAB — ETHANOL: Alcohol, Ethyl (B): <5 mg/dL (ref <5)

## 2016-08-22 LAB — PROTIME-INR
INR: 0.98
Prothrombin Time: 13 seconds (ref 11.4–15.2)

## 2016-08-22 LAB — URINALYSIS, ROUTINE W REFLEX MICROSCOPIC
Bilirubin Urine: NEGATIVE
Glucose, UA: NEGATIVE mg/dL
Hgb urine dipstick: NEGATIVE
Ketones, ur: NEGATIVE mg/dL
Leukocytes, UA: NEGATIVE
NITRITE: NEGATIVE
Protein, ur: NEGATIVE mg/dL
Specific Gravity, Urine: 1.013 (ref 1.005–1.030)
pH: 6 (ref 5.0–8.0)

## 2016-08-22 LAB — I-STAT TROPONIN, ED: Troponin i, poc: 0.01 ng/mL (ref 0.00–0.08)

## 2016-08-22 LAB — TROPONIN I: Troponin I: <0.03 ng/mL (ref <0.03)

## 2016-08-22 MED ORDER — ACETAMINOPHEN 325 MG PO TABS: 650.0000 mg | ORAL_TABLET | ORAL | Status: DC | PRN

## 2016-08-22 MED ORDER — ENOXAPARIN SODIUM 40 MG/0.4ML ~~LOC~~ SOLN
40.0000 mg | SUBCUTANEOUS | Status: DC
  Administered 2016-08-22 – 2016-08-23 (×2): 40 mg via SUBCUTANEOUS
  Filled 2016-08-22 (×2): qty 0.4

## 2016-08-22 MED ORDER — ONDANSETRON HCL 4 MG/2ML IJ SOLN: 4.0000 mg | Freq: Four times a day (QID) | INTRAMUSCULAR | Status: DC | PRN

## 2016-08-22 MED ORDER — HEPARIN SODIUM (PORCINE) 5000 UNIT/ML IJ SOLN: 5000.0000 [IU] | Freq: Three times a day (TID) | INTRAMUSCULAR | Status: DC

## 2016-08-22 MED ORDER — NICOTINE 21 MG/24HR TD PT24
21.0000 mg | MEDICATED_PATCH | Freq: Every day | TRANSDERMAL | Status: DC
  Administered 2016-08-22 – 2016-08-24 (×3): 21 mg via TRANSDERMAL
  Filled 2016-08-22 (×3): qty 1

## 2016-08-22 NOTE — ED Notes (Signed)
The nurse is going to start a IV and collect the labs

## 2016-08-22 NOTE — ED Notes (Signed)
Pt refused snacks offered; pt stated, "I just want to go home now".

## 2016-08-22 NOTE — H&P (Signed)
History and Physical    Dylan MarkerJames D Fabel JXB:[id number] DOB: [birth year] DOA: 08/22/2016   PCP: Gershon CraneStephen Fry, MD Chief Complaint:  Chief Complaint  Patient presents with  . Weakness    HPI: Dylan Houston is a 50 y.o. male with medical history significant of HTN, heart cath some 10+ years ago showing "40% blockage", patient says he "took statins for a while then quit".  Patient presents to the ED via EMS after having weakness and lightheadedness this morning.  He was working in the yard, noticed his HR was increased.  EMS reports 102 HR.  He had L sided chest pain and nausea during ambulance ride but this has resolved.  He has had tachycardia in past but it always rapidly resolved.  Today symptoms persisted so he called EMS.  ED Course: Trop neg x1, EKG is unremarkable x3 (do have EKG in front of me, although they are having trouble because wrong patients name was on the EKG so it is not showing up in his chart).  CP remains resolved.  Review of Systems: As per HPI otherwise 10 point review of systems negative.    Past Medical History:  Diagnosis Date  . Chest pain   . Coronary artery disease   . Hypercholesteremia   . Hypertension   . Panic attack     Past Surgical History:  Procedure Laterality Date  . CARDIAC CATHETERIZATION    . KNEE ARTHROSCOPY W/ MENISCAL REPAIR Left 1995   per [facility]   . ROTATOR CUFF REPAIR Left 1998   per [facility]      reports that he has been smoking Cigarettes.  He has a 34.00 pack-year smoking history. He has never used smokeless tobacco. He reports that he does not drink alcohol or use drugs.  Allergies  Allergen Reactions  . Doxycycline Hyclate Diarrhea and Nausea And Vomiting  . Benadryl [Diphenhydramine Hcl] Anxiety    Family History  Problem Relation Age of Onset  . Heart disease Father   . Emphysema    . Cancer        Prior to Admission medications   Medication Sig Start Date End Date Taking? Authorizing Provider    albuterol (PROVENTIL HFA;VENTOLIN HFA) 108 (90 Base) MCG/ACT inhaler Inhale 2 puffs into the lungs every 4 (four) hours as needed for wheezing or shortness of breath. 10/10/15  Yes Nelwyn SalisburyStephen A Fry, MD  Aspirin-Acetaminophen (GOODY BODY PAIN) 500-325 MG PACK Take 1 Package by mouth daily as needed (pain). Reported on 10/10/2015   Yes Historical Provider, MD  ibuprofen (ADVIL,MOTRIN) 200 MG tablet Take 400 mg by mouth every 6 (six) hours as needed.   Yes Historical Provider, MD  ofloxacin (FLOXIN OTIC) 0.3 % otic solution Place 5 drops into the left ear 2 (two) times daily. 07/29/16   Nelwyn SalisburyStephen A Fry, MD    Physical Exam: Vitals:   08/22/16 1628 08/22/16 1700 08/22/16 1906 08/22/16 2015  BP:  116/87 108/67 109/77  Pulse:  91 81 88  Resp:  14 19 18   Temp:      TempSrc:      SpO2:  97% 100% 99%  Weight: 84.4 kg (186 lb)     Height: 5\' 8"  (1.727 m)         Constitutional: NAD, calm, comfortable Eyes: PERRL, lids and conjunctivae normal ENMT: Mucous membranes are moist. Posterior pharynx clear of any exudate or lesions.Normal dentition.  Neck: normal, supple, no masses, no thyromegaly Respiratory: clear to auscultation bilaterally, no wheezing, no crackles.  Normal respiratory effort. No accessory muscle use.  Cardiovascular: Regular rate and rhythm, no murmurs / rubs / gallops. No extremity edema. 2+ pedal pulses. No carotid bruits.  Abdomen: no tenderness, no masses palpated. No hepatosplenomegaly. Bowel sounds positive.  Musculoskeletal: no clubbing / cyanosis. No joint deformity upper and lower extremities. Good ROM, no contractures. Normal muscle tone.  Skin: no rashes, lesions, ulcers. No induration Neurologic: CN 2-12 grossly intact. Sensation intact, DTR normal. Strength 5/5 in all 4.  Psychiatric: Normal judgment and insight. Alert and oriented x 3. Normal mood.    Labs on Admission: I have personally reviewed following labs and imaging studies  CBC:  Recent Labs Lab  08/22/16 1737  WBC 12.6*  NEUTROABS 9.5*  HGB 15.0  HCT 45.4  MCV 88.5  PLT 272   Basic Metabolic Panel:  Recent Labs Lab 08/22/16 1737  NA 139  K 3.4*  CL 102  CO2 24  GLUCOSE 96  BUN 11  CREATININE 0.67  CALCIUM 9.0   GFR: Estimated Creatinine Clearance: 116.9 mL/min (by C-G formula based on SCr of 0.67 mg/dL). Liver Function Tests:  Recent Labs Lab 08/22/16 1737  AST 17  ALT 11*  ALKPHOS 102  BILITOT 0.4  PROT 8.2*  ALBUMIN 4.3   No results for input(s): LIPASE, AMYLASE in the last 168 hours. No results for input(s): AMMONIA in the last 168 hours. Coagulation Profile:  Recent Labs Lab 08/22/16 1737  INR 0.98   Cardiac Enzymes: No results for input(s): CKTOTAL, CKMB, CKMBINDEX, TROPONINI in the last 168 hours. BNP (last 3 results) No results for input(s): PROBNP in the last 8760 hours. HbA1C: No results for input(s): HGBA1C in the last 72 hours. CBG: No results for input(s): GLUCAP in the last 168 hours. Lipid Profile: No results for input(s): CHOL, HDL, LDLCALC, TRIG, CHOLHDL, LDLDIRECT in the last 72 hours. Thyroid Function Tests: No results for input(s): TSH, T4TOTAL, FREET4, T3FREE, THYROIDAB in the last 72 hours. Anemia Panel: No results for input(s): VITAMINB12, FOLATE, FERRITIN, TIBC, IRON, RETICCTPCT in the last 72 hours. Urine analysis:    Component Value Date/Time   COLORURINE YELLOW 08/22/2016 1715   APPEARANCEUR CLEAR 08/22/2016 1715   LABSPEC 1.013 08/22/2016 1715   PHURINE 6.0 08/22/2016 1715   GLUCOSEU NEGATIVE 08/22/2016 1715   HGBUR NEGATIVE 08/22/2016 1715   HGBUR 3+ 05/07/2009 1507   BILIRUBINUR NEGATIVE 08/22/2016 1715   KETONESUR NEGATIVE 08/22/2016 1715   PROTEINUR NEGATIVE 08/22/2016 1715   UROBILINOGEN 0.2 05/07/2009 1507   NITRITE NEGATIVE 08/22/2016 1715   LEUKOCYTESUR NEGATIVE 08/22/2016 1715   Sepsis Labs: @LABRCNTIP (procalcitonin:4,lacticidven:4) )No results found for this or any previous visit (from the  past 240 hour(s)).   Radiological Exams on Admission: Dg Chest 2 View  Result Date: 08/22/2016 CLINICAL DATA:  Weakness, dizziness and near syncope.  Tachycardia. EXAM: CHEST  2 VIEW COMPARISON:  12/24/2007 FINDINGS: The heart size and mediastinal contours are within normal limits. Both lungs are clear with some at bibasilar atelectasis, left greater than right. No CHF, effusion or pneumothorax. The visualized skeletal structures are unremarkable. IMPRESSION: No active cardiopulmonary disease. Electronically Signed   By: Tollie Eth M.D.   On: 08/22/2016 17:42    EKG: Independently reviewed.  Assessment/Plan Principal Problem:   Chest pain, rule out acute myocardial infarction Active Problems:   Smoking    1. CP r/o 1. Trop neg x1, EKG is unremarkable x3 (do have EKG in front of me, although they are having trouble because wrong patients name  was on the EKG so it is not showing up in his chart).  CP remains resolved. 2. CP obs pathway 3. Serial trops 4. Tele monitor 5. NPO after midnight 6. Call cards in AM to decide stress test vs cath 2. Smoking - 1. had discussion about quitting smoking with patient 2. Nicotine patch for now   DVT prophylaxis: Lovenox Code Status: Full Family Communication: No family in room Consults called: None Admission status: Admit to obs   Brigitta Pricer, Heywood IlesJARED M. DO [facility] Pager [phone] from 7PM-7AM  If 7AM-7PM, please contact the day physician for the patient [email or web address] Password [email or web address]  08/22/2016, 9:48 PM

## 2016-08-22 NOTE — ED Provider Notes (Signed)
[facility]-EMERGENCY DEPT Provider Note   CSN: [id number] Arrival date & time: 08/22/16  1552     History   Chief Complaint Chief Complaint  Patient presents with  . Weakness    HPI Dylan Houston is a 50 y.o. male.  HPI Patient has history of coronary artery disease. He reports he has chronic tachycardia. He states his usual heart rates about 100. He does not take cardiac or antihypertensive medications as prescribed. He reports he took them for about 6 months after he had a cath showing 40% blockage. He reports however the medications had adverse effects and ultimately he discontinued them. He does take daily Goody powder last dose was 11 AM today. Patient was outside doing light to moderate yard work moving wood around. He reports he started to feel a little winded which usually resolves with rest. He also felt his heart racing which again typically resolves with rest. He was trying to rest but symptoms persisted and he felt somewhat lightheaded. He reports once the racing sensation seemed to abate he had slight persisting chest discomfort. He was transported by EMS and reports symptoms resolved with oxygen. He reports he continues to feel slightly lightheaded but the feeling of heart racing and chest discomfort have resolved. Patient is a continuous daily smoker. He reports prior to this he felt well. He has not had recent vomiting or diarrheal illness. He has not been experiencing chest pain cough or fever. No calf pain. Past Medical History:  Diagnosis Date  . Chest pain   . Coronary artery disease   . Hypercholesteremia   . Hypertension   . Panic attack     Patient Active Problem List   Diagnosis Date Noted  . Abscess of left groin   . Fungal skin infection   . Chest pain, rule out acute myocardial infarction 08/22/2016  . Smoking 08/22/2016  . Nicotine dependence 10/10/2015  . 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 HTN (hypertension)  01/09/2014  . HEAT EXHAUSTION, UNSPECIFIED 03/18/2009  . HEADACHE 06/12/2008  . EPISTAXIS 06/12/2008  . NEPHROLITHIASIS, HX OF 05/07/2008  . ACUTE SINUSITIS, UNSPECIFIED 03/05/2008  . Obstructive sleep apnea 02/08/2008  . PALPITATIONS 02/08/2008  . NEPHROLITHIASIS 01/13/2008  . HEMATURIA, HX OF 01/09/2008  . CORONARY ARTERY DISEASE 12/30/2007  . ACUTE CYSTITIS 12/30/2007  . ALLERGIC RHINITIS 12/20/2007  . Anxiety state 05/04/2007  . ASTHMA 05/04/2007    Past Surgical History:  Procedure Laterality Date  . CARDIAC CATHETERIZATION    . KNEE ARTHROSCOPY W/ MENISCAL REPAIR Left 1995   per [facility]   . ROTATOR CUFF REPAIR Left 1998   per [facility]        Home Medications    Prior to Admission medications   Medication Sig Start Date End Date Taking? Authorizing Provider  albuterol (PROVENTIL HFA;VENTOLIN HFA) 108 (90 Base) MCG/ACT inhaler Inhale 2 puffs into the lungs every 6 (six) hours as needed for wheezing or shortness of breath. 08/24/16   Vassie Lollarlos Madera, MD  aspirin EC 81 MG EC tablet Take 1 tablet (81 mg total) by mouth daily. 08/25/16   Vassie Lollarlos Madera, MD  busPIRone (BUSPAR) 5 MG tablet Take 1 tablet (5 mg total) by mouth 3 (three) times daily. 08/24/16   Vassie Lollarlos Madera, MD  cephALEXin (KEFLEX) 500 MG capsule Take 1 capsule (500 mg total) by mouth 3 (three) times daily. 08/24/16   Vassie Lollarlos Madera, MD  nicotine (NICODERM CQ - DOSED IN MG/24 HOURS) 21 mg/24hr patch Place 1  patch (21 mg total) onto the skin daily. 08/25/16   Vassie Loll, MD  Omega 3 1000 MG CAPS Take 1 capsule (1,000 mg total) by mouth 2 (two) times daily. 08/24/16   Vassie Loll, MD  tolnaftate (TINACTIN) 1 % cream Apply topically 2 (two) times daily. Apply to affected area in your torso twice a day 08/24/16   Vassie Loll, MD    Family History Family History  Problem Relation Age of Onset  . Heart disease Father   . Emphysema    . Cancer      Social History Social History  Substance Use Topics  .  Smoking status: Current Every Day Smoker    Packs/day: 1.00    Years: 34.00    Types: Cigarettes  . Smokeless tobacco: Never Used  . Alcohol use No     Allergies   Doxycycline hyclate and Benadryl [diphenhydramine hcl]   Review of Systems Review of Systems 10 Systems reviewed and are negative for acute change except as noted in the HPI.   Physical Exam Updated Vital Signs BP (!) 104/57 (BP Location: Right Arm)   Pulse 83   Temp 97.4 F (36.3 C) (Oral)   Resp 16   Ht 5\' 8"  (1.727 m)   Wt 186 lb (84.4 kg)   SpO2 99%   BMI 28.28 kg/m   Physical Exam  Constitutional: He is oriented to person, place, and time. He appears well-developed and well-nourished.  HENT:  Head: Normocephalic and atraumatic.  Mouth/Throat: Oropharynx is clear and moist.  Eyes: Conjunctivae and EOM are normal.  Neck: Neck supple.  Cardiovascular: Normal rate and regular rhythm.   No murmur heard. Pulmonary/Chest: Effort normal. No respiratory distress.  Breath sounds are very soft no obvious wheeze rhonchi or rail.  Abdominal: Soft. He exhibits no distension. There is no tenderness.  Musculoskeletal: He exhibits no edema, tenderness or deformity.  Neurological: He is alert and oriented to person, place, and time. He exhibits normal muscle tone. Coordination normal.  Skin: Skin is warm and dry.  Psychiatric: He has a normal mood and affect.  Nursing note and vitals reviewed.    ED Treatments / Results  Labs (all labs ordered are listed, but only abnormal results are displayed) Labs Reviewed  COMPREHENSIVE METABOLIC PANEL - Abnormal; Notable for the following:       Result Value   Potassium 3.4 (*)    Total Protein 8.2 (*)    ALT 11 (*)    All other components within normal limits  CBC WITH DIFFERENTIAL/PLATELET - Abnormal; Notable for the following:    WBC 12.6 (*)    Neutro Abs 9.5 (*)    All other components within normal limits  LIPID PANEL - Abnormal; Notable for the following:     HDL 32 (*)    All other components within normal limits  BASIC METABOLIC PANEL - Abnormal; Notable for the following:    Creatinine, Ser 0.56 (*)    Calcium 8.7 (*)    All other components within normal limits  ETHANOL  PROTIME-INR  URINALYSIS, ROUTINE W REFLEX MICROSCOPIC  RAPID URINE DRUG SCREEN, HOSP PERFORMED  TROPONIN I  TROPONIN I  TROPONIN I  CBC  I-STAT TROPOININ, ED    EKG  EKG Interpretation None       Radiology No results found.  Procedures Procedures (including critical care time)  Medications Ordered in ED Medications - No data to display   Initial Impression / Assessment and Plan / ED Course  I have reviewed the triage vital signs and the nursing notes.  Pertinent labs & imaging results that were available during my care of the patient were reviewed by me and considered in my medical decision making (see chart for details).  Clinical Course     Final Clinical Impressions(s) / ED Diagnoses   Final diagnoses:  Palpitation  Chest pain with high risk for cardiac etiology   Patient has significant risk factors for coronary artery disease. He developed exertional symptoms of both palpitation and lightheadedness with chest pain. At this time, plan will be for admission and observation. New Prescriptions Discharge Medication List as of 08/24/2016  1:04 PM    START taking these medications   Details  aspirin EC 81 MG EC tablet Take 1 tablet (81 mg total) by mouth daily., Starting Tue 08/25/2016, Print    busPIRone (BUSPAR) 5 MG tablet Take 1 tablet (5 mg total) by mouth 3 (three) times daily., Starting Mon 08/24/2016, Print    cephALEXin (KEFLEX) 500 MG capsule Take 1 capsule (500 mg total) by mouth 3 (three) times daily., Starting Mon 08/24/2016, Print    nicotine (NICODERM CQ - DOSED IN MG/24 HOURS) 21 mg/24hr patch Place 1 patch (21 mg total) onto the skin daily., Starting Tue 08/25/2016, Print    Omega 3 1000 MG CAPS Take 1 capsule (1,000 mg total) by  mouth 2 (two) times daily., Starting Mon 08/24/2016, Print    tolnaftate (TINACTIN) 1 % cream Apply topically 2 (two) times daily. Apply to affected area in your torso twice a day, Starting Mon 08/24/2016, Normal         Arby BarretteMarcy Rhylee Nunn, MD 08/28/16 [phone]

## 2016-08-22 NOTE — ED Triage Notes (Signed)
[facility] after complaining of increased weakness and lightheadedness this morning. He says that he was working in the yard and noticed that his heart rate was increased. EMS reports 102 heart rate. He says that he was feeling nauseous earlier today but it has resolved. He says he is eating and drinking normally and that he has had instances of tachycardia in the past that have resolved sooner. Since he has continued to feel badly he called EMS. No syncope, slurred speech, confusion, or fall. Denies pain. Has hx of cardiac cath in 2007 (approximate) which he says showed a 40% blockage. He stopped taking medications from the 'heart doctor' 6 months post cath due to side effects of medications.

## 2016-08-23 ENCOUNTER — Ambulatory Visit (HOSPITAL_COMMUNITY)
Admit: 2016-08-23 | Discharge: 2016-08-23 | Disposition: A | Discharge status: Home / Self Care | Payer: Self-pay | Attending: Internal Medicine | Admitting: Internal Medicine

## 2016-08-23 DIAGNOSIS — F411 Generalized anxiety disorder: Secondary | ICD-10-CM

## 2016-08-23 DIAGNOSIS — B369 Superficial mycosis, unspecified: Secondary | ICD-10-CM

## 2016-08-23 DIAGNOSIS — F17200 Nicotine dependence, unspecified, uncomplicated: Secondary | ICD-10-CM

## 2016-08-23 DIAGNOSIS — R079 Chest pain, unspecified: Secondary | ICD-10-CM

## 2016-08-23 DIAGNOSIS — L02214 Cutaneous abscess of groin: Secondary | ICD-10-CM

## 2016-08-23 DIAGNOSIS — E785 Hyperlipidemia, unspecified: Secondary | ICD-10-CM

## 2016-08-23 LAB — NM MYOCAR MULTI W/SPECT W/WALL MOTION / EF
CSEPED: 1 min
CSEPEW: 2.9 METS
CSEPHR: 85 %
Exercise duration (sec): 53 s
MPHR: 170 {beats}/min
Peak HR: 145 {beats}/min
Rest HR: 107 {beats}/min

## 2016-08-23 LAB — LIPID PANEL
CHOL/HDL RATIO: 4.5 ratio
CHOLESTEROL: 144 mg/dL (ref 0–200)
HDL: 32 mg/dL — AB (ref >40)
LDL Cholesterol: 91 mg/dL (ref 0–99)
Triglycerides: 106 mg/dL (ref <150)
VLDL: 21 mg/dL (ref 0–40)

## 2016-08-23 LAB — TROPONIN I: Troponin I: <0.03 ng/mL (ref <0.03)

## 2016-08-23 MED ORDER — TECHNETIUM TC 99M TETROFOSMIN IV KIT
10.0000 | PACK | Freq: Once | INTRAVENOUS | Status: AC | PRN
  Administered 2016-08-23: 10 via INTRAVENOUS

## 2016-08-23 MED ORDER — SODIUM CHLORIDE 0.9 % IV SOLN
INTRAVENOUS | Status: DC
  Administered 2016-08-23 – 2016-08-24 (×2): via INTRAVENOUS

## 2016-08-23 MED ORDER — TECHNETIUM TC 99M TETROFOSMIN IV KIT
30.0000 | PACK | Freq: Once | INTRAVENOUS | Status: AC | PRN
  Administered 2016-08-23: 30 via INTRAVENOUS

## 2016-08-23 MED ORDER — TOLNAFTATE 1 % EX CREA
TOPICAL_CREAM | Freq: Two times a day (BID) | CUTANEOUS | Status: DC
  Administered 2016-08-23: 1 via TOPICAL
  Administered 2016-08-24: 09:00:00 via TOPICAL
  Filled 2016-08-23: qty 30

## 2016-08-23 MED ORDER — ASPIRIN EC 81 MG PO TBEC
81.0000 mg | DELAYED_RELEASE_TABLET | Freq: Every day | ORAL | Status: DC
  Administered 2016-08-23 – 2016-08-24 (×2): 81 mg via ORAL
  Filled 2016-08-23 (×2): qty 1

## 2016-08-23 MED ORDER — DEXTROSE 5 % IV SOLN
1.0000 g | INTRAVENOUS | Status: DC
  Administered 2016-08-23: 1 g via INTRAVENOUS
  Filled 2016-08-23 (×2): qty 10

## 2016-08-23 MED ORDER — BUSPIRONE HCL 5 MG PO TABS
5.0000 mg | ORAL_TABLET | Freq: Three times a day (TID) | ORAL | Status: DC
  Administered 2016-08-23 – 2016-08-24 (×3): 5 mg via ORAL
  Filled 2016-08-23 (×3): qty 1

## 2016-08-23 MED ORDER — TECHNETIUM TC 99M TETROFOSMIN IV KIT: 10.0000 | PACK | Freq: Once | INTRAVENOUS | Status: DC | PRN

## 2016-08-23 NOTE — Progress Notes (Addendum)
   Dylan Houston presented for a nuclear stress test today. His HR was noted to go up to 120-130 just with standing. Allowed him to stabilize in the baseline 110 range before proceeding. He had poor exercise tolerance - patient felt dizzy so test terminated early. He reached his target HR literally within 15 seconds of test. Stress imaging is pending at this time.  Preliminary EKG findings may be listed in the chart, but the stress test result will not be finalized until perfusion imaging is complete.  Also of clinical interest, his baseline HR almost always appear >100 per review of vitals in 2017.  Laurann Montanaayna N Serita Degroote, PA-C 08/23/2016, 12:35 PM
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
## 2016-08-23 NOTE — Progress Notes (Signed)
Stress test today:  Pt had poor exercise tolerance  Heart rate increased rapidly  Patient showed evid of orthostatic tachycardia with HR increasing to 125 with stanidg  Reached target HR quickly  Got dizzy Myovue scan with no evid of ischemia  Probable soft tissue attenuation.  Normal function  REcomm Get orthostatic BP/P (laying , sitting, standing at 3 mint) Pt will prob need to increase hydration, salt intake    May benefit with IV fluids before D/C    This may explain all of patient's symtoms  Will make sure he has f/u

## 2016-08-23 NOTE — Progress Notes (Addendum)
[facility] requesting a follow-up appointment, and our office will call the patient with this information. Spoke with patient's nurse to make her aware to pass on this as well as stress test result onto patient.  Christalynn Boise PA-C

## 2016-08-23 NOTE — Progress Notes (Signed)
[facility] PROGRESS NOTE  Dylan MarkerJames D Houston UJW:[id number] DOB: 11-04-1965 DOA: 08/22/2016 PCP: Gershon CraneStephen Fry, MD  Interim summary and HPI 50 y.o. male with medical history significant of HTN, heart cath some 10+ years ago showing "40% blockage", patient says he "took statins for a while then quit".  Patient presents to the ED via EMS after having weakness and lightheadedness this morning.  He was working in the yard, noticed his HR was increased.  EMS reports 102 HR.  He had L sided chest pain and nausea during ambulance ride but this has resolved.  He has had tachycardia in past but it always rapidly resolved.  Today symptoms persisted so he called EMS and came to ED for further evaluation.  Assessment/Plan: 1-CP and SOB: -heart score 4 -troponin neg and no acute ischemic changes on EKG -patient seen by cardiology and Myoview stress test demonstrated low probability risk -will continue ASA and start statins base on lipid profile -patient advised to quit smoking   2-dizziness and tachycardia -will provide IVF's and check orthostatic VS  3-anxiety -started on buspar TID  4-tobacco abuse -cessation counseling provided   5-left groin abscess/furuncle  -started on rocephin -warm compresses  -general surgery consulted  6-skin fungal infection (torso) -will start treatment with tinactin BID -advise to keep area clean and dry  Code Status: Full Family Communication: fiancee at bedside  Disposition Plan: anticipate home discharge in the next 24 hours or so. Evaluating clinical response on his groin abscess/induration, to determine if he needs I&D. Continue IV abx's   Consultants:  Cardiology   General surgery   Procedures:  Myoview stress test: no ischemic changes; preserved EF and low risk probability   Antibiotics:  Rocephin 12/31  HPI/Subjective: Afebrile, slightly anxious. Currently CP and in no major distress. Reports some changes in the skin of his torso and  complaints of pain/swelling in his left groin.  Objective: Vitals:   08/23/16 0544 08/23/16 1336  BP: 108/76 123/71  Pulse: 92 99  Resp: 16 19  Temp: 97.9 F (36.6 C) 98 F (36.7 C)    Intake/Output Summary (Last 24 hours) at 08/23/16 1442 Last data filed at 08/23/16 0930  Gross per 24 hour  Intake                0 ml  Output                0 ml  Net                0 ml   Filed Weights   08/22/16 1628  Weight: 84.4 kg (186 lb)    Exam:   General:  Afebrile, no CP currently, denies nausea and vomiting. Endorses pain in his left groin. No nausea, no vomiting.  Cardiovascular: S1 and S2, no rubs or gallops  Respiratory: scattered rhonchi, no wheezing, no crackles   Abdomen: soft, NT, ND, positive BS  Musculoskeletal: no edema or cyanosis   Skin: positive fungal discoloration on his torso (costal area bilaterally) and with left groin swelling/indurated area. No active drainage seen, but positive fluctuation on palpation.  Data Reviewed: Basic Metabolic Panel:  Recent Labs Lab 08/22/16 1737  NA 139  K 3.4*  CL 102  CO2 24  GLUCOSE 96  BUN 11  CREATININE 0.67  CALCIUM 9.0   Liver Function Tests:  Recent Labs Lab 08/22/16 1737  AST 17  ALT 11*  ALKPHOS 102  BILITOT 0.4  PROT 8.2*  ALBUMIN 4.3  CBC:  Recent Labs Lab 08/22/16 1737  WBC 12.6*  NEUTROABS 9.5*  HGB 15.0  HCT 45.4  MCV 88.5  PLT 272   Cardiac Enzymes:  Recent Labs Lab 08/22/16 2152 08/23/16 0051 08/23/16 0513  TROPONINI <0.03 <0.03 <0.03    Studies: Dg Chest 2 View  Result Date: 08/22/2016 CLINICAL DATA:  Weakness, dizziness and near syncope.  Tachycardia. EXAM: CHEST  2 VIEW COMPARISON:  12/24/2007 FINDINGS: The heart size and mediastinal contours are within normal limits. Both lungs are clear with some at bibasilar atelectasis, left greater than right. No CHF, effusion or pneumothorax. The visualized skeletal structures are unremarkable. IMPRESSION: No active  cardiopulmonary disease. Electronically Signed   By: Tollie Ethavid  Kwon M.D.   On: 08/22/2016 17:42   Nm Myocar Multi W/spect W/wall Motion / Ef  Result Date: 08/23/2016 CLINICAL DATA:  Weakness, dizziness lightheadedness, shortness of breath and history of coronary artery disease with prior cardiac catheterization. EXAM: MYOCARDIAL IMAGING WITH SPECT (REST AND PHARMACOLOGIC-STRESS) GATED LEFT VENTRICULAR WALL MOTION STUDY LEFT VENTRICULAR EJECTION FRACTION TECHNIQUE: Standard myocardial SPECT imaging was performed after resting intravenous injection of 10 mCi [id number] tetrofosmin. Subsequently, intravenous infusion of Lexiscan was performed under the supervision of the Cardiology staff. At peak effect of the drug, 30 mCi [id number] tetrofosmin was injected intravenously and standard myocardial SPECT imaging was performed. Quantitative gated imaging was also performed to evaluate left ventricular wall motion, and estimate left ventricular ejection fraction. COMPARISON:  None. FINDINGS: Perfusion: No evidence of inducible myocardial ischemia. There is evidence of the inferior and inferoseptal attenuation on both rest and stress acquisitions. This is felt to be at least partially secondary to diaphragmatic attenuation based on review of raw planar imaging. A component of scar cannot be excluded. Wall Motion: Normal left ventricular wall motion. No left ventricular dilation. Left Ventricular Ejection Fraction: 59 % End diastolic volume 76 ml End systolic volume 31 ml IMPRESSION: 1. No evidence of inducible myocardial ischemia. Inferior and inferoseptal attenuation is felt to be at least partially secondary to diaphragmatic attenuation. Component of scar may be present. 2. Normal left ventricular wall motion. 3. Left ventricular ejection fraction 59% 4. Non invasive risk stratification*: Low *2012 Appropriate Use Criteria for Coronary Revascularization Focused Update: J Am Coll Cardiol. 2012;59(9):857-881.  http://content.dementiazones.comonlinejacc.org/article.aspx?articleid=1201161 Electronically Signed   By: Irish LackGlenn  Yamagata M.D.   On: 08/23/2016 14:26    Scheduled Meds: . busPIRone  5 mg Oral TID  . enoxaparin (LOVENOX) injection  40 mg Subcutaneous Q24H  . nicotine  21 mg Transdermal Daily   Continuous Infusions:  Principal Problem:   Chest pain, rule out acute myocardial infarction Active Problems:   Smoking    Time spent: 25    Vassie LollMadera, Ekam Besson  [facility] Pager [phone]. If 7PM-7AM, please contact night-coverage at [email or web address], password [email or web address] 08/23/2016, 2:42 PM  LOS: 0 days

## 2016-08-23 NOTE — Consult Note (Signed)
Chief Complaint:  Draining area in left groin  History of Present Illness:  Dylan Houston is an 50 y.o. male who came in to [facility] for tachcardia and SOB.  He was found to have a tender indurated nodule in the left groin.  It has begun to drain.  He has had this before and been seen in the ED.    Past Medical History:  Diagnosis Date  . Chest pain   . Coronary artery disease   . Hypercholesteremia   . Hypertension   . Panic attack     Past Surgical History:  Procedure Laterality Date  . CARDIAC CATHETERIZATION    . KNEE ARTHROSCOPY W/ MENISCAL REPAIR Left 1995   per [facility]   . ROTATOR CUFF REPAIR Left 1998   per [facility]     Current Facility-Administered Medications  Medication Dose Route Frequency Provider Last Rate Last Dose  . 0.9 %  sodium chloride infusion   Intravenous Continuous Carlos Madera, MD      . acetaminophen (TYLENOL) tablet 650 mg  650 mg Oral Q4H PRN Jared M Gardner, DO      . busPIRone (BUSPAR) tablet 5 mg  5 mg Oral TID Carlos Madera, MD      . cefTRIAXone (ROCEPHIN) 1 g in dextrose 5 % 50 mL IVPB  1 g Intravenous Q24H Matthew Martin, MD      . enoxaparin (LOVENOX) injection 40 mg  40 mg Subcutaneous Q24H Jared M Gardner, DO   40 mg at 08/22/16 2313  . nicotine (NICODERM CQ - dosed in mg/24 hours) patch 21 mg  21 mg Transdermal Daily Jared M Gardner, DO   21 mg at 08/23/16 0923  . ondansetron (ZOFRAN) injection 4 mg  4 mg Intravenous Q6H PRN Jared M Gardner, DO      . tolnaftate (TINACTIN) 1 % cream   Topical BID Carlos Madera, MD       Doxycycline hyclate and Benadryl [diphenhydramine hcl] Family History  Problem Relation Age of Onset  . Heart disease Father   . Emphysema    . Cancer     Social History:   reports that he has been smoking Cigarettes.  He has a 34.00 pack-year smoking history. He has never used smokeless tobacco. He reports that he does not drink alcohol or use drugs.   REVIEW OF SYSTEMS : Negative except for see problem  list  Physical Exam:   Blood pressure 123/71, pulse 99, temperature 98 F (36.7 C), temperature source Oral, resp. rate 19, height 5' 8" (1.727 m), weight 84.4 kg (186 lb), SpO2 100 %. Body mass index is 28.28 kg/m.  Gen:  [facility] WM NAD  GU:  Left groin is a draining furuncle or focus of hidradenitis Musculoskeletal: Normal range of motion. Extremities are nontender. No cyanosis, edema or clubbing noted Lymphadenopathy: No cervical, preauricular, postauricular or axillary adenopathy is present Skin: Skin is warm and dry. Multiple tatoos   No rash noted. No diaphoresis. No erythema. No pallor. Pscyh: Normal mood and affect. Behavior is normal. Judgment and thought content normal.   LABORATORY RESULTS: Results for orders placed or performed during the hospital encounter of 08/22/16 (from the past 48 hour(s))  Urinalysis, Routine w reflex microscopic     Status: None   Collection Time: 08/22/16  5:15 PM  Result Value Ref Range   Color, Urine YELLOW YELLOW   APPearance CLEAR CLEAR   Specific Gravity, Urine 1.013 1.005 - 1.030   pH 6.0 5.0 - 8.0     Glucose, UA NEGATIVE NEGATIVE mg/dL   Hgb urine dipstick NEGATIVE NEGATIVE   Bilirubin Urine NEGATIVE NEGATIVE   Ketones, ur NEGATIVE NEGATIVE mg/dL   Protein, ur NEGATIVE NEGATIVE mg/dL   Nitrite NEGATIVE NEGATIVE   Leukocytes, UA NEGATIVE NEGATIVE  Urine rapid drug screen (hosp performed)     Status: None   Collection Time: 08/22/16  5:15 PM  Result Value Ref Range   Opiates NONE DETECTED NONE DETECTED   Cocaine NONE DETECTED NONE DETECTED   Benzodiazepines NONE DETECTED NONE DETECTED   Amphetamines NONE DETECTED NONE DETECTED   Tetrahydrocannabinol NONE DETECTED NONE DETECTED   Barbiturates NONE DETECTED NONE DETECTED    Comment:        DRUG SCREEN FOR MEDICAL PURPOSES ONLY.  IF CONFIRMATION IS NEEDED FOR ANY PURPOSE, NOTIFY LAB WITHIN 5 DAYS.        LOWEST DETECTABLE LIMITS FOR URINE DRUG SCREEN Drug Class       Cutoff  (ng/mL) Amphetamine      1000 Barbiturate      200 Benzodiazepine   200 Tricyclics       300 Opiates          300 Cocaine          300 THC              50   Comprehensive metabolic panel     Status: Abnormal   Collection Time: 08/22/16  5:37 PM  Result Value Ref Range   Sodium 139 135 - 145 mmol/L   Potassium 3.4 (L) 3.5 - 5.1 mmol/L   Chloride 102 101 - 111 mmol/L   CO2 24 22 - 32 mmol/L   Glucose, Bld 96 65 - 99 mg/dL   BUN 11 6 - 20 mg/dL   Creatinine, Ser 0.67 0.61 - 1.24 mg/dL   Calcium 9.0 8.9 - 10.3 mg/dL   Total Protein 8.2 (H) 6.5 - 8.1 g/dL   Albumin 4.3 3.5 - 5.0 g/dL   AST 17 15 - 41 U/L   ALT 11 (L) 17 - 63 U/L   Alkaline Phosphatase 102 38 - 126 U/L   Total Bilirubin 0.4 0.3 - 1.2 mg/dL   GFR calc non Af Amer >60 >60 mL/min   GFR calc Af Amer >60 >60 mL/min    Comment: (NOTE) The eGFR has been calculated using the CKD EPI equation. This calculation has not been validated in all clinical situations. eGFR's persistently <60 mL/min signify possible Chronic Kidney Disease.    Anion gap 13 5 - 15  Ethanol     Status: None   Collection Time: 08/22/16  5:37 PM  Result Value Ref Range   Alcohol, Ethyl (B) <5 <5 mg/dL    Comment:        LOWEST DETECTABLE LIMIT FOR SERUM ALCOHOL IS 5 mg/dL FOR MEDICAL PURPOSES ONLY   CBC with Differential     Status: Abnormal   Collection Time: 08/22/16  5:37 PM  Result Value Ref Range   WBC 12.6 (H) 4.0 - 10.5 K/uL   RBC 5.13 4.22 - 5.81 MIL/uL   Hemoglobin 15.0 13.0 - 17.0 g/dL   HCT 45.4 39.0 - 52.0 %   MCV 88.5 78.0 - 100.0 fL   MCH 29.2 26.0 - 34.0 pg   MCHC 33.0 30.0 - 36.0 g/dL   RDW 13.9 11.5 - 15.5 %   Platelets 272 150 - 400 K/uL   Neutrophils Relative % 74 %   Neutro Abs 9.5 (H) 1.7 -   7.7 K/uL   Lymphocytes Relative 19 %   Lymphs Abs 2.3 0.7 - 4.0 K/uL   Monocytes Relative 6 %   Monocytes Absolute 0.7 0.1 - 1.0 K/uL   Eosinophils Relative 1 %   Eosinophils Absolute 0.1 0.0 - 0.7 K/uL   Basophils Relative  0 %   Basophils Absolute 0.0 0.0 - 0.1 K/uL  Protime-INR     Status: None   Collection Time: 08/22/16  5:37 PM  Result Value Ref Range   Prothrombin Time 13.0 11.4 - 15.2 seconds   INR 0.98   I-stat troponin, ED     Status: None   Collection Time: 08/22/16  5:44 PM  Result Value Ref Range   Troponin i, poc 0.01 0.00 - 0.08 ng/mL   Comment 3            Comment: Due to the release kinetics of cTnI, a negative result within the first hours of the onset of symptoms does not rule out myocardial infarction with certainty. If myocardial infarction is still suspected, repeat the test at appropriate intervals.   Troponin I-serum (0, 3, 6 hours)     Status: None   Collection Time: 08/22/16  9:52 PM  Result Value Ref Range   Troponin I <0.03 <0.03 ng/mL  Troponin I-serum (0, 3, 6 hours)     Status: None   Collection Time: 08/23/16 12:51 AM  Result Value Ref Range   Troponin I <0.03 <0.03 ng/mL  Troponin I-serum (0, 3, 6 hours)     Status: None   Collection Time: 08/23/16  5:13 AM  Result Value Ref Range   Troponin I <0.03 <0.03 ng/mL  Lipid panel     Status: Abnormal   Collection Time: 08/23/16  9:44 AM  Result Value Ref Range   Cholesterol 144 0 - 200 mg/dL   Triglycerides 106 <150 mg/dL   HDL 32 (L) >40 mg/dL   Total CHOL/HDL Ratio 4.5 RATIO   VLDL 21 0 - 40 mg/dL   LDL Cholesterol 91 0 - 99 mg/dL    Comment:        Total Cholesterol/HDL:CHD Risk Coronary Heart Disease Risk Table                     Men   Women  1/2 Average Risk   3.4   3.3  Average Risk       5.0   4.4  2 X Average Risk   9.6   7.1  3 X Average Risk  23.4   11.0        Use the calculated Patient Ratio above and the CHD Risk Table to determine the patient's CHD Risk.        ATP III CLASSIFICATION (LDL):  <100     mg/dL   Optimal  100-129  mg/dL   Near or Above                    Optimal  130-159  mg/dL   Borderline  160-189  mg/dL   High  >190     mg/dL   Very High Performed at [facility]: Dg Chest 2 View  Result Date: 08/22/2016 CLINICAL DATA:  Weakness, dizziness and near syncope.  Tachycardia. EXAM: CHEST  2 VIEW COMPARISON:  12/24/2007 FINDINGS: The heart size and mediastinal contours are within normal limits. Both lungs are clear with some at bibasilar atelectasis, left greater than  right. No CHF, effusion or pneumothorax. The visualized skeletal structures are unremarkable. IMPRESSION: No active cardiopulmonary disease. Electronically Signed   By: David  Kwon M.D.   On: 08/22/2016 17:42   Nm Myocar Multi W/spect W/wall Motion / Ef  Result Date: 08/23/2016 CLINICAL DATA:  Weakness, dizziness lightheadedness, shortness of breath and history of coronary artery disease with prior cardiac catheterization. EXAM: MYOCARDIAL IMAGING WITH SPECT (REST AND PHARMACOLOGIC-STRESS) GATED LEFT VENTRICULAR WALL MOTION STUDY LEFT VENTRICULAR EJECTION FRACTION TECHNIQUE: Standard myocardial SPECT imaging was performed after resting intravenous injection of 10 mCi Tc-99m tetrofosmin. Subsequently, intravenous infusion of Lexiscan was performed under the supervision of the Cardiology staff. At peak effect of the drug, 30 mCi Tc-99m tetrofosmin was injected intravenously and standard myocardial SPECT imaging was performed. Quantitative gated imaging was also performed to evaluate left ventricular wall motion, and estimate left ventricular ejection fraction. COMPARISON:  None. FINDINGS: Perfusion: No evidence of inducible myocardial ischemia. There is evidence of the inferior and inferoseptal attenuation on both rest and stress acquisitions. This is felt to be at least partially secondary to diaphragmatic attenuation based on review of raw planar imaging. A component of scar cannot be excluded. Wall Motion: Normal left ventricular wall motion. No left ventricular dilation. Left Ventricular Ejection Fraction: 59 % End diastolic volume 76 ml End systolic volume 31 ml IMPRESSION: 1. No  evidence of inducible myocardial ischemia. Inferior and inferoseptal attenuation is felt to be at least partially secondary to diaphragmatic attenuation. Component of scar may be present. 2. Normal left ventricular wall motion. 3. Left ventricular ejection fraction 59% 4. Non invasive risk stratification*: Low *2012 Appropriate Use Criteria for Coronary Revascularization Focused Update: J Am Coll Cardiol. 2012;59(9):857-881. http://content.onlinejacc.org/article.aspx?articleid=[email or web address] Electronically Signed   By: Glenn  Yamagata M.D.   On: 08/23/2016 14:26    Problem List: Patient Active Problem List   Diagnosis Date Noted  . Chest pain, rule out acute myocardial infarction 08/22/2016  . Smoking 08/22/2016  . Nicotine dependence 10/10/2015  . 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 HTN (hypertension) 01/09/2014  . HEAT EXHAUSTION, UNSPECIFIED 03/18/2009  . HEADACHE 06/12/2008  . EPISTAXIS 06/12/2008  . NEPHROLITHIASIS, HX OF 05/07/2008  . ACUTE SINUSITIS, UNSPECIFIED 03/05/2008  . Obstructive sleep apnea 02/08/2008  . PALPITATIONS 02/08/2008  . NEPHROLITHIASIS 01/13/2008  . HEMATURIA, HX OF 01/09/2008  . CORONARY ARTERY DISEASE 12/30/2007  . ACUTE CYSTITIS 12/30/2007  . ALLERGIC RHINITIS 12/20/2007  . ANXIETY 05/04/2007  . ASTHMA 05/04/2007    Assessment & Plan: This area is currently draining.  Will cover with Rocephin and advise shower and hygiene.  Will not take to the OR for debridement at this time.  Will follow     Matt B. Martin, MD, FACS  [facility] [phone] beeper [phone]  08/23/2016 5:55 PM   .  

## 2016-08-24 DIAGNOSIS — B369 Superficial mycosis, unspecified: Secondary | ICD-10-CM

## 2016-08-24 DIAGNOSIS — L02214 Cutaneous abscess of groin: Secondary | ICD-10-CM

## 2016-08-24 LAB — BASIC METABOLIC PANEL
Anion gap: 8 (ref 5–15)
BUN: 11 mg/dL (ref 6–20)
CHLORIDE: 106 mmol/L (ref 101–111)
CO2: 25 mmol/L (ref 22–32)
CREATININE: 0.56 mg/dL — AB (ref 0.61–1.24)
Calcium: 8.7 mg/dL — ABNORMAL LOW (ref 8.9–10.3)
GFR calc Af Amer: >60 mL/min (ref >60)
GFR calc non Af Amer: >60 mL/min (ref >60)
GLUCOSE: 97 mg/dL (ref 65–99)
POTASSIUM: 4 mmol/L (ref 3.5–5.1)
Sodium: 139 mmol/L (ref 135–145)

## 2016-08-24 LAB — CBC
HCT: 40.8 % (ref 39.0–52.0)
Hemoglobin: 13.5 g/dL (ref 13.0–17.0)
MCH: 29.4 pg (ref 26.0–34.0)
MCHC: 33.1 g/dL (ref 30.0–36.0)
MCV: 88.9 fL (ref 78.0–100.0)
PLATELETS: 238 10*3/uL (ref 150–400)
RBC: 4.59 MIL/uL (ref 4.22–5.81)
RDW: 14.1 % (ref 11.5–15.5)
WBC: 9 10*3/uL (ref 4.0–10.5)

## 2016-08-24 MED ORDER — ASPIRIN 81 MG PO TBEC: 81.0000 mg | DELAYED_RELEASE_TABLET | Freq: Every day | ORAL | 1 refills | Status: DC

## 2016-08-24 MED ORDER — NICOTINE 21 MG/24HR TD PT24: 21.0000 mg | MEDICATED_PATCH | Freq: Every day | TRANSDERMAL | 0 refills | Status: DC

## 2016-08-24 MED ORDER — CEPHALEXIN 500 MG PO CAPS: 500.0000 mg | ORAL_CAPSULE | Freq: Three times a day (TID) | ORAL | 0 refills | Status: DC

## 2016-08-24 MED ORDER — OMEGA 3 1000 MG PO CAPS: 1000.0000 mg | ORAL_CAPSULE | Freq: Two times a day (BID) | ORAL | 1 refills | Status: DC

## 2016-08-24 MED ORDER — ALBUTEROL SULFATE HFA 108 (90 BASE) MCG/ACT IN AERS: 2.0000 | INHALATION_SPRAY | Freq: Four times a day (QID) | RESPIRATORY_TRACT | 2 refills | Status: DC | PRN

## 2016-08-24 MED ORDER — OMEGA-3-ACID ETHYL ESTERS 1 G PO CAPS
1.0000 g | ORAL_CAPSULE | Freq: Two times a day (BID) | ORAL | Status: DC
  Administered 2016-08-24: 1 g via ORAL
  Filled 2016-08-24: qty 1

## 2016-08-24 MED ORDER — BUSPIRONE HCL 5 MG PO TABS: 5.0000 mg | ORAL_TABLET | Freq: Three times a day (TID) | ORAL | 0 refills | Status: DC

## 2016-08-24 MED ORDER — TOLNAFTATE 1 % EX CREA: TOPICAL_CREAM | Freq: Two times a day (BID) | CUTANEOUS | 0 refills | Status: DC

## 2016-08-24 NOTE — Progress Notes (Signed)
Pt had orthostatics yesterday and today  HR improved with IV fluids  I would reocmm increasing fluid and salt intake   Will make sure pt has f/u appt in 3 to 4 wks to eval response  OK to d/c.

## 2016-08-24 NOTE — Progress Notes (Signed)
Patient ID: Dylan Houston, male   DOB: 27-Jan-1966, 51 y.o.   MRN: [id number] [facility] Surgery Progress Note:   * No surgery found *  Subjective: Mental status is clear.  He had some issues when they stood him up today but that had to do with his heart and not his leg.   Objective: Vital signs in last 24 hours: Temp:  [97.4 F (36.3 C)-98 F (36.7 C)] 97.4 F (36.3 C) (01/01 0628) Pulse Rate:  [83-99] 83 (01/01 0628) Resp:  [16-19] 16 (01/01 0628) BP: (100-123)/(56-75) 104/57 (01/01 0628) SpO2:  [94 %-100 %] 99 % (01/01 0628)  Intake/Output from previous day: 12/31 0701 - 01/01 0700 In: 843 [I.V.:793; IV Piggyback:50] Out: -  Intake/Output this shift: No intake/output data recorded.  Physical Exam: Work of breathing is normal.  The leg has drained and is pretty flat.  He has a scrotal nodule across from it that is nontender and chronic.    Lab Results:  Results for orders placed or performed during the hospital encounter of 08/22/16 (from the past 48 hour(s))  Urinalysis, Routine w reflex microscopic     Status: None   Collection Time: 08/22/16  5:15 PM  Result Value Ref Range   Color, Urine YELLOW YELLOW   APPearance CLEAR CLEAR   Specific Gravity, Urine 1.013 1.005 - 1.030   pH 6.0 5.0 - 8.0   Glucose, UA NEGATIVE NEGATIVE mg/dL   Hgb urine dipstick NEGATIVE NEGATIVE   Bilirubin Urine NEGATIVE NEGATIVE   Ketones, ur NEGATIVE NEGATIVE mg/dL   Protein, ur NEGATIVE NEGATIVE mg/dL   Nitrite NEGATIVE NEGATIVE   Leukocytes, UA NEGATIVE NEGATIVE  Urine rapid drug screen (hosp performed)     Status: None   Collection Time: 08/22/16  5:15 PM  Result Value Ref Range   Opiates NONE DETECTED NONE DETECTED   Cocaine NONE DETECTED NONE DETECTED   Benzodiazepines NONE DETECTED NONE DETECTED   Amphetamines NONE DETECTED NONE DETECTED   Tetrahydrocannabinol NONE DETECTED NONE DETECTED   Barbiturates NONE DETECTED NONE DETECTED    Comment:        DRUG SCREEN FOR MEDICAL  PURPOSES ONLY.  IF CONFIRMATION IS NEEDED FOR ANY PURPOSE, NOTIFY LAB WITHIN 5 DAYS.        LOWEST DETECTABLE LIMITS FOR URINE DRUG SCREEN Drug Class       Cutoff (ng/mL) Amphetamine      1000 Barbiturate      200 Benzodiazepine   751 Tricyclics       700 Opiates          300 Cocaine          300 THC              50   Comprehensive metabolic panel     Status: Abnormal   Collection Time: 08/22/16  5:37 PM  Result Value Ref Range   Sodium 139 135 - 145 mmol/L   Potassium 3.4 (L) 3.5 - 5.1 mmol/L   Chloride 102 101 - 111 mmol/L   CO2 24 22 - 32 mmol/L   Glucose, Bld 96 65 - 99 mg/dL   BUN 11 6 - 20 mg/dL   Creatinine, Ser 0.67 0.61 - 1.24 mg/dL   Calcium 9.0 8.9 - 10.3 mg/dL   Total Protein 8.2 (H) 6.5 - 8.1 g/dL   Albumin 4.3 3.5 - 5.0 g/dL   AST 17 15 - 41 U/L   ALT 11 (L) 17 - 63 U/L   Alkaline Phosphatase  102 38 - 126 U/L   Total Bilirubin 0.4 0.3 - 1.2 mg/dL   GFR calc non Af Amer >60 >60 mL/min   GFR calc Af Amer >60 >60 mL/min    Comment: (NOTE) The eGFR has been calculated using the CKD EPI equation. This calculation has not been validated in all clinical situations. eGFR's persistently <60 mL/min signify possible Chronic Kidney Disease.    Anion gap 13 5 - 15  Ethanol     Status: None   Collection Time: 08/22/16  5:37 PM  Result Value Ref Range   Alcohol, Ethyl (B) <5 <5 mg/dL    Comment:        LOWEST DETECTABLE LIMIT FOR SERUM ALCOHOL IS 5 mg/dL FOR MEDICAL PURPOSES ONLY   CBC with Differential     Status: Abnormal   Collection Time: 08/22/16  5:37 PM  Result Value Ref Range   WBC 12.6 (H) 4.0 - 10.5 K/uL   RBC 5.13 4.22 - 5.81 MIL/uL   Hemoglobin 15.0 13.0 - 17.0 g/dL   HCT 45.4 39.0 - 52.0 %   MCV 88.5 78.0 - 100.0 fL   MCH 29.2 26.0 - 34.0 pg   MCHC 33.0 30.0 - 36.0 g/dL   RDW 13.9 11.5 - 15.5 %   Platelets 272 150 - 400 K/uL   Neutrophils Relative % 74 %   Neutro Abs 9.5 (H) 1.7 - 7.7 K/uL   Lymphocytes Relative 19 %   Lymphs Abs 2.3 0.7 -  4.0 K/uL   Monocytes Relative 6 %   Monocytes Absolute 0.7 0.1 - 1.0 K/uL   Eosinophils Relative 1 %   Eosinophils Absolute 0.1 0.0 - 0.7 K/uL   Basophils Relative 0 %   Basophils Absolute 0.0 0.0 - 0.1 K/uL  Protime-INR     Status: None   Collection Time: 08/22/16  5:37 PM  Result Value Ref Range   Prothrombin Time 13.0 11.4 - 15.2 seconds   INR 0.98   I-stat troponin, ED     Status: None   Collection Time: 08/22/16  5:44 PM  Result Value Ref Range   Troponin i, poc 0.01 0.00 - 0.08 ng/mL   Comment 3            Comment: Due to the release kinetics of cTnI, a negative result within the first hours of the onset of symptoms does not rule out myocardial infarction with certainty. If myocardial infarction is still suspected, repeat the test at appropriate intervals.   Troponin I-serum (0, 3, 6 hours)     Status: None   Collection Time: 08/22/16  9:52 PM  Result Value Ref Range   Troponin I <0.03 <0.03 ng/mL  Troponin I-serum (0, 3, 6 hours)     Status: None   Collection Time: 08/23/16 12:51 AM  Result Value Ref Range   Troponin I <0.03 <0.03 ng/mL  Troponin I-serum (0, 3, 6 hours)     Status: None   Collection Time: 08/23/16  5:13 AM  Result Value Ref Range   Troponin I <0.03 <0.03 ng/mL  Lipid panel     Status: Abnormal   Collection Time: 08/23/16  9:44 AM  Result Value Ref Range   Cholesterol 144 0 - 200 mg/dL   Triglycerides 106 <150 mg/dL   HDL 32 (L) >40 mg/dL   Total CHOL/HDL Ratio 4.5 RATIO   VLDL 21 0 - 40 mg/dL   LDL Cholesterol 91 0 - 99 mg/dL    Comment:  Total Cholesterol/HDL:CHD Risk Coronary Heart Disease Risk Table                     Men   Women  1/2 Average Risk   3.4   3.3  Average Risk       5.0   4.4  2 X Average Risk   9.6   7.1  3 X Average Risk  23.4   11.0        Use the calculated Patient Ratio above and the CHD Risk Table to determine the patient's CHD Risk.        ATP III CLASSIFICATION (LDL):  <100     mg/dL   Optimal  100-129   mg/dL   Near or Above                    Optimal  130-159  mg/dL   Borderline  160-189  mg/dL   High  >190     mg/dL   Very High Performed at [facility]   CBC     Status: None   Collection Time: 08/24/16  5:24 AM  Result Value Ref Range   WBC 9.0 4.0 - 10.5 K/uL   RBC 4.59 4.22 - 5.81 MIL/uL   Hemoglobin 13.5 13.0 - 17.0 g/dL   HCT 40.8 39.0 - 52.0 %   MCV 88.9 78.0 - 100.0 fL   MCH 29.4 26.0 - 34.0 pg   MCHC 33.1 30.0 - 36.0 g/dL   RDW 14.1 11.5 - 15.5 %   Platelets 238 150 - 400 K/uL  Basic metabolic panel     Status: Abnormal   Collection Time: 08/24/16  5:24 AM  Result Value Ref Range   Sodium 139 135 - 145 mmol/L   Potassium 4.0 3.5 - 5.1 mmol/L   Chloride 106 101 - 111 mmol/L   CO2 25 22 - 32 mmol/L   Glucose, Bld 97 65 - 99 mg/dL   BUN 11 6 - 20 mg/dL   Creatinine, Ser 0.56 (L) 0.61 - 1.24 mg/dL   Calcium 8.7 (L) 8.9 - 10.3 mg/dL   GFR calc non Af Amer >60 >60 mL/min   GFR calc Af Amer >60 >60 mL/min    Comment: (NOTE) The eGFR has been calculated using the CKD EPI equation. This calculation has not been validated in all clinical situations. eGFR's persistently <60 mL/min signify possible Chronic Kidney Disease.    Anion gap 8 5 - 15    Radiology/Results: Dg Chest 2 View  Result Date: 08/22/2016 CLINICAL DATA:  Weakness, dizziness and near syncope.  Tachycardia. EXAM: CHEST  2 VIEW COMPARISON:  12/24/2007 FINDINGS: The heart size and mediastinal contours are within normal limits. Both lungs are clear with some at bibasilar atelectasis, left greater than right. No CHF, effusion or pneumothorax. The visualized skeletal structures are unremarkable. IMPRESSION: No active cardiopulmonary disease. Electronically Signed   By: Ashley Royalty M.D.   On: 08/22/2016 17:42   Nm Myocar Multi W/spect W/wall Motion / Ef  Result Date: 08/23/2016 CLINICAL DATA:  Weakness, dizziness lightheadedness, shortness of breath and history of coronary artery disease with prior  cardiac catheterization. EXAM: MYOCARDIAL IMAGING WITH SPECT (REST AND PHARMACOLOGIC-STRESS) GATED LEFT VENTRICULAR WALL MOTION STUDY LEFT VENTRICULAR EJECTION FRACTION TECHNIQUE: Standard myocardial SPECT imaging was performed after resting intravenous injection of 10 mCi Tc-61mtetrofosmin. Subsequently, intravenous infusion of Lexiscan was performed under the supervision of the Cardiology staff. At peak effect of the drug,  30 mCi Tc-26mtetrofosmin was injected intravenously and standard myocardial SPECT imaging was performed. Quantitative gated imaging was also performed to evaluate left ventricular wall motion, and estimate left ventricular ejection fraction. COMPARISON:  None. FINDINGS: Perfusion: No evidence of inducible myocardial ischemia. There is evidence of the inferior and inferoseptal attenuation on both rest and stress acquisitions. This is felt to be at least partially secondary to diaphragmatic attenuation based on review of raw planar imaging. A component of scar cannot be excluded. Wall Motion: Normal left ventricular wall motion. No left ventricular dilation. Left Ventricular Ejection Fraction: 59 % End diastolic volume 76 ml End systolic volume 31 ml IMPRESSION: 1. No evidence of inducible myocardial ischemia. Inferior and inferoseptal attenuation is felt to be at least partially secondary to diaphragmatic attenuation. Component of scar may be present. 2. Normal left ventricular wall motion. 3. Left ventricular ejection fraction 59% 4. Non invasive risk stratification*: Low *2012 Appropriate Use Criteria for Coronary Revascularization Focused Update: J Am Coll Cardiol. 28891;69(4):503-888 http://content.oairportbarriers.comaspx?articleid=[email or web address] Electronically Signed   By: GAletta EdouardM.D.   On: 08/23/2016 14:26    Anti-infectives: Anti-infectives    Start     Dose/Rate Route Frequency Ordered Stop   08/23/16 1800  cefTRIAXone (ROCEPHIN) 1 g in dextrose 5 % 50 mL IVPB     1 g 100  mL/hr over 30 Minutes Intravenous Every 24 hours 08/23/16 1755        Assessment/Plan: Problem List: Patient Active Problem List   Diagnosis Date Noted  . Chest pain, rule out acute myocardial infarction 08/22/2016  . Smoking 08/22/2016  . Nicotine dependence 10/10/2015  . COPD (chronic obstructive pulmonary disease) with [place]sema [place] 10/10/2015  . HTN (hypertension) 01/09/2014  . HEAT EXHAUSTION, UNSPECIFIED 03/18/2009  . HEADACHE 06/12/2008  . EPISTAXIS 06/12/2008  . NEPHROLITHIASIS, HX OF 05/07/2008  . ACUTE SINUSITIS, UNSPECIFIED 03/05/2008  . Obstructive sleep apnea 02/08/2008  . PALPITATIONS 02/08/2008  . NEPHROLITHIASIS 01/13/2008  . HEMATURIA, HX OF 01/09/2008  . CORONARY ARTERY DISEASE 12/30/2007  . ACUTE CYSTITIS 12/30/2007  . ALLERGIC RHINITIS 12/20/2007  . ANXIETY 05/04/2007  . ASTHMA 05/04/2007    Continue Rocephin while in the hospital for his cardiac issues.   * No surgery found *    LOS: 1 day   Matt B. MHassell Done MD, FThe Eye AssociatesSurgery, P.A[facility] [phone] [phone] 08/24/2016 9:17 AM

## 2016-08-24 NOTE — Discharge Summary (Signed)
Physician Discharge Summary  Florinda MarkerJames D Dimare YQM:[id number] DOB: [birth year] DOA: 08/22/2016  PCP: Gershon CraneStephen Fry, MD  Admit date: 08/22/2016 Discharge date: 08/24/2016  Time spent: 35 minutes  Recommendations for Outpatient Follow-up:  1. Repeat BMET to follow electrolytes  2. Reassess BP and orthostatic changes  3. Assist patient with smoking cessation and follow anxiety disorder    Discharge Diagnoses:  Principal Problem:   Chest pain, rule out acute myocardial infarction Active Problems:   Smoking   Abscess of left groin   Fungal skin infection   Discharge Condition: stable and improved. Will discharge home with instructions to follow up with PCP in 2 weeks. Patient will also follow up with cardiology in 3-4 weeks.   Diet recommendation: heart healthy (low fat)  Filed Weights   08/22/16 1628  Weight: 84.4 kg (186 lb)    History of present illness:  50 y.o.malewith medical history significant of HTN, heart cath some 10+ years ago showing "40% blockage", patient says he "took statins for a while then quit". Patient presents to the ED via EMS after having weakness and lightheadedness this morning. He was working in the yard, noticed his HR was increased. EMS reports 102 HR. He had L sided chest pain and nausea during ambulance ride but this has resolved. He has had tachycardia in past but it always rapidly resolved. Today symptoms persisted so he called EMS and came to ED for further evaluation.  Hospital Course:  1-CP, palpitations and SOB: -heart score 4 -troponin neg and no acute ischemic changes on EKG -patient seen by cardiology and Myoview stress test demonstrated low probability risk; patient has remained CP free. Will follow up with cardiology in 3-4 weeks after discharge. -no B-blocker or any other medications currently given soft BP -will continue ASA and started on fish oil for dyslipidemia -patient advised to quit smoking   2-dizziness and tachycardia -mild  orthostatic on admission and while attempting stress test -received IVF's resuscitation and orthostatic VS WNL at discharge -patient instructed to keep himself well hydrated and to follow adequate intake of solutes -advise to quit caffeine and taurine   3-anxiety -started on buspar TID -will need follow up with PCP and further adjustments or changes on medications as needed  -base on response and symptoms progression, might benefit of psych referral as an outpatient .  4-tobacco abuse -cessation counseling provided  -nicotine patch prescribed   5-left groin abscess/furuncle  -started on rocephin and warm compresses  -general surgery consulted; but abscess busted and drained on its own -will discharge on keflex and complete 7 days of treatment.  6-skin fungal infection (torso) -will discharge on treatment with tinactin BID -advise to keep area clean and dry   Procedures:  See below for x-ray reports  Myoview stress test: no ischemic changes; preserved EF and low risk probability   Consultations:  Cardiology  General surgery   Discharge Exam: Vitals:   08/23/16 2153 08/24/16 0628  BP: 105/67 (!) 104/57  Pulse: 86 83  Resp: 16 16  Temp: 97.9 F (36.6 C) 97.4 F (36.3 C)    General:  Afebrile, no CP currently, denies nausea and vomiting. Endorses pain in his left groin, but abscess has drained spontaneously. No nausea, no vomiting.  Cardiovascular: S1 and S2, no rubs or gallops  Respiratory: scattered rhonchi, no wheezing, no crackles   Abdomen: soft, NT, ND, positive BS  Musculoskeletal: no edema or cyanosis   Skin: positive fungal discoloration on his torso (costal area bilaterally) and with  left groin mild erythema, area is now flat as abscess drained spontaneously.   Discharge Instructions   Discharge Instructions    Diet - low sodium heart healthy    Complete by:  As directed    Discharge instructions    Complete by:  As directed    Follow up  with PCP in 2 weeks Follow up with cardiology as an outpatient (office will set up appointment) Stop smoking Keep yourself well hydrated   Increase activity slowly    Complete by:  As directed      Current Discharge Medication List    START taking these medications   Details  aspirin EC 81 MG EC tablet Take 1 tablet (81 mg total) by mouth daily. Qty: 30 tablet, Refills: 1    busPIRone (BUSPAR) 5 MG tablet Take 1 tablet (5 mg total) by mouth 3 (three) times daily. Qty: 90 tablet, Refills: 0    cephALEXin (KEFLEX) 500 MG capsule Take 1 capsule (500 mg total) by mouth 3 (three) times daily. Qty: 21 capsule, Refills: 0    nicotine (NICODERM CQ - DOSED IN MG/24 HOURS) 21 mg/24hr patch Place 1 patch (21 mg total) onto the skin daily. Qty: 28 patch, Refills: 0    Omega 3 1000 MG CAPS Take 1 capsule (1,000 mg total) by mouth 2 (two) times daily. Qty: 60 each, Refills: 1    tolnaftate (TINACTIN) 1 % cream Apply topically 2 (two) times daily. Apply to affected area in your torso twice a day Qty: 30 g, Refills: 0      CONTINUE these medications which have CHANGED   Details  albuterol (PROVENTIL HFA;VENTOLIN HFA) 108 (90 Base) MCG/ACT inhaler Inhale 2 puffs into the lungs every 6 (six) hours as needed for wheezing or shortness of breath. Qty: 1 Inhaler, Refills: 2      STOP taking these medications     Aspirin-Acetaminophen (GOODY BODY PAIN) 500-325 MG PACK      ibuprofen (ADVIL,MOTRIN) 200 MG tablet      ofloxacin (FLOXIN OTIC) 0.3 % otic solution        Allergies  Allergen Reactions  . Doxycycline Hyclate Diarrhea and Nausea And Vomiting  . Benadryl [Diphenhydramine Hcl] Anxiety   Follow-up Information    Dietrich Pates, MD Follow up.   Specialty:  Cardiology Why:  Dr. Charlott Rakes office will call you with your follow-up appointment. Contact information: [address] [place] [address] [phone]        Gershon Crane, MD Follow up in 2 week(s).    Specialty:  Family Medicine Contact information: [address] [place] [address] [phone]           The results of significant diagnostics from this hospitalization (including imaging, microbiology, ancillary and laboratory) are listed below for reference.    Significant Diagnostic Studies: Dg Chest 2 View  Result Date: 08/22/2016 CLINICAL DATA:  Weakness, dizziness and near syncope.  Tachycardia. EXAM: CHEST  2 VIEW COMPARISON:  12/24/2007 FINDINGS: The heart size and mediastinal contours are within normal limits. Both lungs are clear with some at bibasilar atelectasis, left greater than right. No CHF, effusion or pneumothorax. The visualized skeletal structures are unremarkable. IMPRESSION: No active cardiopulmonary disease. Electronically Signed   By: Tollie Eth M.D.   On: 08/22/2016 17:42   Nm Myocar Multi W/spect W/wall Motion / Ef  Result Date: 08/23/2016 CLINICAL DATA:  Weakness, dizziness lightheadedness, shortness of breath and history of coronary artery disease with prior cardiac  catheterization. EXAM: MYOCARDIAL IMAGING WITH SPECT (REST AND PHARMACOLOGIC-STRESS) GATED LEFT VENTRICULAR WALL MOTION STUDY LEFT VENTRICULAR EJECTION FRACTION TECHNIQUE: Standard myocardial SPECT imaging was performed after resting intravenous injection of 10 mCi Tc-77m tetrofosmin. Subsequently, intravenous infusion of Lexiscan was performed under the supervision of the Cardiology staff. At peak effect of the drug, 30 mCi Tc-59m tetrofosmin was injected intravenously and standard myocardial SPECT imaging was performed. Quantitative gated imaging was also performed to evaluate left ventricular wall motion, and estimate left ventricular ejection fraction. COMPARISON:  None. FINDINGS: Perfusion: No evidence of inducible myocardial ischemia. There is evidence of the inferior and inferoseptal attenuation on both rest and stress acquisitions. This is felt to be at least partially secondary to  diaphragmatic attenuation based on review of raw planar imaging. A component of scar cannot be excluded. Wall Motion: Normal left ventricular wall motion. No left ventricular dilation. Left Ventricular Ejection Fraction: 59 % End diastolic volume 76 ml End systolic volume 31 ml IMPRESSION: 1. No evidence of inducible myocardial ischemia. Inferior and inferoseptal attenuation is felt to be at least partially secondary to diaphragmatic attenuation. Component of scar may be present. 2. Normal left ventricular wall motion. 3. Left ventricular ejection fraction 59% 4. Non invasive risk stratification*: Low *2012 Appropriate Use Criteria for Coronary Revascularization Focused Update: J Am Coll Cardiol. 2012;59(9):857-881. http://content.[email or web address].aspx?articleid=[email or web address] Electronically Signed   By: Irish Lack M.D.   On: 08/23/2016 14:26   Microbiology: No results found for this or any previous visit (from the past 240 hour(s)).   Labs: Basic Metabolic Panel:  Recent Labs Lab 08/22/16 1737 08/24/16 0524  NA 139 139  K 3.4* 4.0  CL 102 106  CO2 24 25  GLUCOSE 96 97  BUN 11 11  CREATININE 0.67 0.56*  CALCIUM 9.0 8.7*   Liver Function Tests:  Recent Labs Lab 08/22/16 1737  AST 17  ALT 11*  ALKPHOS 102  BILITOT 0.4  PROT 8.2*  ALBUMIN 4.3   CBC:  Recent Labs Lab 08/22/16 1737 08/24/16 0524  WBC 12.6* 9.0  NEUTROABS 9.5*  --   HGB 15.0 13.5  HCT 45.4 40.8  MCV 88.5 88.9  PLT 272 238   Cardiac Enzymes:  Recent Labs Lab 08/22/16 2152 08/23/16 0051 08/23/16 0513  TROPONINI <0.03 <0.03 <0.03    Signed:  Vassie Loll MD.  [facility] 08/24/2016, 12:28 PM

## 2016-09-07 ENCOUNTER — Encounter: Payer: Self-pay | Admitting: *Deleted

## 2016-09-21 ENCOUNTER — Ambulatory Visit: Payer: Self-pay | Admitting: Internal Medicine

## 2016-10-22 NOTE — Progress Notes (Deleted)
   Cardiology Office Note   Date:  10/22/2016   ID:  Dylan Houston, DOB Jan 20, 1966, MRN [id number]  PCP:  Gershon CraneStephen Fry, MD  Cardiologist:   Dietrich PatesPaula Fern Canova, MD       History of Present Illness: Dylan Houston is a 51 y.o. male with a history of mild CAD by cath in 2010  Stopped statind Admitted with weakness, lightheadedness  SOB   I saw the pt on recent hospitalizeation    Treadmil showed poor exercise tolerance with rapid increase in HR  Myovue neg for ischemia   Recomm increasing fluids and salt       No outpatient prescriptions have been marked as taking for the 10/23/16 encounter (Appointment) with Pricilla RifflePaula Carden Teel V, MD.     Allergies:   Doxycycline hyclate and Benadryl [diphenhydramine hcl]   Past Medical History:  Diagnosis Date  . Chest pain   . Coronary artery disease   . Hypercholesteremia   . Hypertension   . Panic attack     Past Surgical History:  Procedure Laterality Date  . CARDIAC CATHETERIZATION    . KNEE ARTHROSCOPY W/ MENISCAL REPAIR Left 1995   per [facility]   . ROTATOR CUFF REPAIR Left 1998   per [facility]      Social History:  The patient  reports that he has been smoking Cigarettes.  He has a 34.00 pack-year smoking history. He has never used smokeless tobacco. He reports that he does not drink alcohol or use drugs.   Family History:  The patient's family history includes Cancer in his mother; Heart disease in his father; Lung cancer (age of onset: 4764) in his father.    ROS:  Please see the history of present illness. All other systems are reviewed and  Negative to the above problem except as noted.    PHYSICAL EXAM: VS:  There were no vitals taken for this visit.  GEN: Well nourished, well developed, in no acute distress  HEENT: normal  Neck: no JVD, carotid bruits, or masses Cardiac: RRR; no murmurs, rubs, or gallops,no edema  Respiratory:  clear to auscultation bilaterally, normal work of breathing GI: soft, nontender, nondistended,  + BS  No hepatomegaly  MS: no deformity Moving all extremities   Skin: warm and dry, no rash Neuro:  Strength and sensation are intact Psych: euthymic mood, full affect   EKG:  EKG is ordered today.   Lipid Panel    Component Value Date/Time   CHOL 144 08/23/2016 0944   TRIG 106 08/23/2016 0944   HDL 32 (L) 08/23/2016 0944   CHOLHDL 4.5 08/23/2016 0944   VLDL 21 08/23/2016 0944   LDLCALC 91 08/23/2016 0944       Dizziness/weakness   2  HL    3  Tob    4  Anxiety1   Current medicines are reviewed at length with the patient today.  The patient does not have concerns regarding medicines.  Signed, Dietrich PatesPaula Delano Scardino, MD  10/22/2016 11:45 PM    [facility] [address], [place], [place]  [address] Phone: [phone]; Fax: [phone]

## 2016-10-23 ENCOUNTER — Ambulatory Visit: Payer: Self-pay | Admitting: Internal Medicine

## 2016-11-04 ENCOUNTER — Encounter: Payer: Self-pay | Admitting: Internal Medicine

## 2017-03-31 ENCOUNTER — Encounter: Payer: Self-pay | Admitting: Family Medicine

## 2017-03-31 ENCOUNTER — Ambulatory Visit (INDEPENDENT_AMBULATORY_CARE_PROVIDER_SITE_OTHER): Payer: Self-pay | Admitting: Family Medicine

## 2017-03-31 VITALS — BP 125/84 | HR 95 | Temp 98.4°F | Ht 68.0 in | Wt 175.0 lb

## 2017-03-31 DIAGNOSIS — K529 Noninfective gastroenteritis and colitis, unspecified: Secondary | ICD-10-CM

## 2017-03-31 NOTE — Progress Notes (Signed)
   Subjective:    Patient ID: Dylan Houston, male    DOB: 12/25/1965, 51 y.o.   MRN: [id number]  HPI Here for 3 days of abdominal cramps, nausea with vomiting, and diarrhea. No fever. No recent travel. He is drinking fluids but he cannot take much food.   Review of Systems  Constitutional: Negative.   Respiratory: Negative.   Cardiovascular: Negative.   Gastrointestinal: Positive for abdominal pain, diarrhea, nausea and vomiting. Negative for abdominal distention, anal bleeding, blood in stool and constipation.  Genitourinary: Negative.        Objective:   Physical Exam  Constitutional: He is oriented to person, place, and time. He appears well-developed and well-nourished. No distress.  Eyes: Conjunctivae are normal. No scleral icterus.  Cardiovascular: Normal rate, regular rhythm, normal heart sounds and intact distal pulses.   Pulmonary/Chest: Effort normal and breath sounds normal. No respiratory distress. He has no wheezes. He has no rales.  Abdominal: Soft. Bowel sounds are normal. He exhibits no distension and no mass. There is no tenderness. There is no rebound and no guarding.  Neurological: He is alert and oriented to person, place, and time.          Assessment & Plan:  Enteritis, treat with Cipro for 7 days. Use Phenergan as needed for nausea.  Gershon CraneStephen Finnleigh Marchetti, MD

## 2017-03-31 NOTE — Patient Instructions (Signed)
WE NOW OFFER   [facility] FAST TRACK!!!  SAME DAY Appointments for ACUTE CARE  Such as: Sprains, Injuries, cuts, abrasions, rashes, muscle pain, joint pain, back pain Colds, flu, sore throats, headache, allergies, cough, fever  Ear pain, sinus and eye infections Abdominal pain, nausea, vomiting, diarrhea, upset stomach Animal/insect bites  3 Easy Ways to Schedule: Walk-In Scheduling Call in scheduling Mychart Sign-up: [email or web address]         

## 2017-05-13 ENCOUNTER — Encounter: Payer: Self-pay | Admitting: Family Medicine

## 2018-04-11 ENCOUNTER — Other Ambulatory Visit: Payer: Self-pay

## 2018-04-11 ENCOUNTER — Emergency Department (HOSPITAL_COMMUNITY)
Admission: EM | Admit: 2018-04-11 | Discharge: 2018-04-11 | Disposition: A | Discharge status: Home / Self Care | Payer: Self-pay | Attending: Emergency Medicine | Admitting: Emergency Medicine

## 2018-04-11 ENCOUNTER — Encounter (HOSPITAL_COMMUNITY): Payer: Self-pay | Admitting: Emergency Medicine

## 2018-04-11 DIAGNOSIS — F17210 Nicotine dependence, cigarettes, uncomplicated: Secondary | ICD-10-CM | POA: Insufficient documentation

## 2018-04-11 DIAGNOSIS — I10 Essential (primary) hypertension: Secondary | ICD-10-CM | POA: Insufficient documentation

## 2018-04-11 DIAGNOSIS — Z7982 Long term (current) use of aspirin: Secondary | ICD-10-CM | POA: Insufficient documentation

## 2018-04-11 DIAGNOSIS — Z79899 Other long term (current) drug therapy: Secondary | ICD-10-CM | POA: Insufficient documentation

## 2018-04-11 DIAGNOSIS — R21 Rash and other nonspecific skin eruption: Secondary | ICD-10-CM | POA: Insufficient documentation

## 2018-04-11 DIAGNOSIS — J449 Chronic obstructive pulmonary disease, unspecified: Secondary | ICD-10-CM | POA: Insufficient documentation

## 2018-04-11 DIAGNOSIS — F419 Anxiety disorder, unspecified: Secondary | ICD-10-CM | POA: Insufficient documentation

## 2018-04-11 DIAGNOSIS — I2510 Atherosclerotic heart disease of native coronary artery without angina pectoris: Secondary | ICD-10-CM | POA: Insufficient documentation

## 2018-04-11 MED ORDER — BETAMETHASONE DIPROPIONATE 0.05 % EX OINT: TOPICAL_OINTMENT | Freq: Two times a day (BID) | CUTANEOUS | 1 refills | Status: DC

## 2018-04-11 MED ORDER — METHOCARBAMOL 500 MG PO TABS: 750.0000 mg | ORAL_TABLET | Freq: Once | ORAL | Status: DC

## 2018-04-11 NOTE — Discharge Instructions (Addendum)
I have prescribed cream for the rash. Please apply cream to both arms twice a day until resolution of the rash.  If you experience worsening in symptoms please return to the ED for reevaluation.  Also provided a referral to dermatology for your sebaceous cyst.

## 2018-04-11 NOTE — ED Provider Notes (Signed)
[facility] Provider Note   CSN: [id number] Arrival date & time: 04/11/18  1951     History   Chief Complaint Chief Complaint  Patient presents with  . Rash    HPI Dylan Houston is a 52 y.o. male.  52 y/o male with a PMH of COPD,HTN to the ED with a chief complaint of rash x2 weeks.  States the rash began on his left forearm and then transfer onto his right arm.  Hives or rash as itching on and off and sometimes a burning sensation once the sores are open.  Has tried applying calamine lotion, which hazel but states no relief in symptoms.  She denies any changes in detergent, soaps, lotion.  Does have a dog in the house but states the dog has no fleas.  He denies any fever, chest pain, abdominal pain or other complaints.  In addition patient has cyst along the sole bridge of his nose.  States this is has been there for 2 weeks and it starting to get bigger, and applies warm compresses and the cyst reduces but then comes back even larger.  Patient states that he is now feeling pressure with the cyst along his sinus region.  Denies any headaches, rhinorrhea, fever.     Past Medical History:  Diagnosis Date  . Chest pain   . Coronary artery disease   . Hypercholesteremia   . Hypertension   . Panic attack     Patient Active Problem List   Diagnosis Date Noted  . Abscess of left groin   . Fungal skin infection   . Chest pain, rule out acute myocardial infarction 08/22/2016  . Smoking 08/22/2016  . Nicotine dependence 10/10/2015  . 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 HTN (hypertension) 01/09/2014  . HEAT EXHAUSTION, UNSPECIFIED 03/18/2009  . HEADACHE 06/12/2008  . EPISTAXIS 06/12/2008  . NEPHROLITHIASIS, HX OF 05/07/2008  . ACUTE SINUSITIS, UNSPECIFIED 03/05/2008  . Obstructive sleep apnea 02/08/2008  . PALPITATIONS 02/08/2008  . NEPHROLITHIASIS 01/13/2008  . HEMATURIA, HX OF 01/09/2008    . CORONARY ARTERY DISEASE 12/30/2007  . ACUTE CYSTITIS 12/30/2007  . ALLERGIC RHINITIS 12/20/2007  . Anxiety state 05/04/2007  . ASTHMA 05/04/2007    Past Surgical History:  Procedure Laterality Date  . CARDIAC CATHETERIZATION    . KNEE ARTHROSCOPY W/ MENISCAL REPAIR Left 1995   per [facility]   . ROTATOR CUFF REPAIR Left 1998   per [facility]         Home Medications    Prior to Admission medications   Medication Sig Start Date End Date Taking? Authorizing Provider  albuterol (PROVENTIL HFA;VENTOLIN HFA) 108 (90 Base) MCG/ACT inhaler Inhale 2 puffs into the lungs every 6 (six) hours as needed for wheezing or shortness of breath. 08/24/16   Vassie LollMadera, Carlos, MD  aspirin EC 81 MG EC tablet Take 1 tablet (81 mg total) by mouth daily. 08/25/16   Vassie LollMadera, Carlos, MD  betamethasone dipropionate (DIPROLENE) 0.05 % ointment Apply topically 2 (two) times daily. Apply topically 2 times daily. 04/11/18   Tyah Acord, Leonie DouglasJohana, PA-C  Omega 3 1000 MG CAPS Take 1 capsule (1,000 mg total) by mouth 2 (two) times daily. 08/24/16   Vassie LollMadera, Carlos, MD    Family History Family History  Problem Relation Age of Onset  . Cancer Mother   . Heart disease Father   . Lung cancer Father 4364  . Emphysema Unknown   . Cancer Unknown  Social History Social History   Tobacco Use  . Smoking status: Current Every Day Smoker    Packs/day: 1.00    Years: 34.00    Pack years: 34.00    Types: Cigarettes  . Smokeless tobacco: Never Used  Substance Use Topics  . Alcohol use: No    Alcohol/week: 0.0 standard drinks  . Drug use: No     Allergies   Doxycycline hyclate and Benadryl [diphenhydramine hcl]   Review of Systems Review of Systems  Constitutional: Negative for chills and fever.  HENT: Negative for ear pain and sore throat.   Eyes: Negative for pain and visual disturbance.  Respiratory: Negative for cough and shortness of breath.   Cardiovascular: Negative for chest pain and palpitations.   Gastrointestinal: Negative for abdominal pain and vomiting.  Genitourinary: Negative for dysuria and hematuria.  Musculoskeletal: Negative for arthralgias and back pain.  Skin: Positive for rash. Negative for color change.  Neurological: Negative for seizures and syncope.  All other systems reviewed and are negative.    Physical Exam Updated Vital Signs BP 125/86 (BP Location: Right Arm)   Pulse 97   Temp 98 F (36.7 C) (Oral)   Resp 20   Ht 5\' 7"  (1.702 m)   Wt 79.4 kg   SpO2 94%   BMI 27.41 kg/m   Physical Exam  Constitutional: He is oriented to person, place, and time. He appears well-developed and well-nourished.  HENT:  Head: Normocephalic and atraumatic.    Mouth/Throat: Oropharynx is clear and moist.  Sebaceous cyst noted along the top area of the nasal bridge.  Patient denies any vision changes.  Eyes: No scleral icterus.  Neck: Normal range of motion.  Cardiovascular: Normal heart sounds.  Pulmonary/Chest: Effort normal and breath sounds normal. He has no wheezes. He exhibits no tenderness.  Abdominal: Soft. Bowel sounds are normal. He exhibits no distension. There is no tenderness.  Musculoskeletal: He exhibits no tenderness or deformity.  Neurological: He is alert and oriented to person, place, and time.  Skin: Skin is warm and dry. Rash noted. Rash is urticarial.     There is an urticarial rash along both of his forearms.  There is some crusting and erythema present.  The drainage is from the wound.  Nursing note and vitals reviewed.    ED Treatments / Results  Labs (all labs ordered are listed, but only abnormal results are displayed) Labs Reviewed - No data to display  EKG None  Radiology No results found.  Procedures Procedures (including critical care time)  Medications Ordered in ED Medications - No data to display   Initial Impression / Assessment and Plan / ED Course  I have reviewed the triage vital signs and the nursing  notes.  Pertinent labs & imaging results that were available during my care of the patient were reviewed by me and considered in my medical decision making (see chart for details).     Patient presents to the ED with a rash x2 weeks.  Rash looks purpuric and erythematous along both forearms.  Advised patient to keep areas dry and apply topical steroid cream.  Denies any fever, drainage, systemic symptoms.  As time I will treat patient for a contact dermatitis.  Vitals stable during visit.  Return precautions provided.  Final Clinical Impressions(s) / ED Diagnoses   Final diagnoses:  Rash    ED Discharge Orders         Ordered    betamethasone dipropionate (DIPROLENE) 0.05 % ointment  2 times daily     04/11/18 2218           Claude MangesSoto, Anayely Constantine, Cordelia Poche-C 04/11/18 2221    Eber HongMiller, Brian, MD 04/13/18 1009

## 2018-04-11 NOTE — ED Provider Notes (Signed)
Patient placed in Quick Look pathway, seen and evaluated   Chief Complaint: Cyst, Rash  HPI:   52 year old male presents with a worsening cyst on the right side of his nose for "awhile". It has become more irritated and painful. He has been squeezing it and getting some out of it but not a lot. Additionally he has a rash on his bilateral arms and lower abdomen. The rash on his arms has been present for 3 weeks. He thought it was poison ivy so tried witch hazel, alcohol, calamine without significant relief. It is itchy and painful at times  ROS: +rash, +cyst  Physical Exam:   Gen: No distress  Neuro: Awake and Alert  Skin: Warm    Focused Exam: HENT: Firm, indurated cyst over the right bridge of the nose. Multiple blackheads.     Skin: Erythematous rash over the bilateral anterior arms. Erythema over the belt line    Initiation of care has begun. The patient has been counseled on the process, plan, and necessity for staying for the completion/evaluation, and the remainder of the medical screening examination    Beryle QuantGekas, Rees Santistevan Marie, PA-C 04/11/18 2010    Benjiman CorePickering, Nathan, MD 04/11/18 2352

## 2018-04-11 NOTE — ED Triage Notes (Signed)
Pt reports having bump on nose  X 1 month. Pt also reports rash to bilateral inner forearms x 2 weeks. Pt reports worsening. Pt reports trying witch hazel and calamine with no relief.

## 2019-06-20 ENCOUNTER — Other Ambulatory Visit: Payer: Self-pay

## 2019-06-20 ENCOUNTER — Encounter: Payer: Self-pay | Admitting: Family Medicine

## 2019-06-20 ENCOUNTER — Telehealth (INDEPENDENT_AMBULATORY_CARE_PROVIDER_SITE_OTHER): Payer: HRSA Program | Admitting: Family Medicine

## 2019-06-20 DIAGNOSIS — J0100 Acute maxillary sinusitis, unspecified: Secondary | ICD-10-CM

## 2019-06-20 DIAGNOSIS — R05 Cough: Secondary | ICD-10-CM | POA: Diagnosis not present

## 2019-06-20 DIAGNOSIS — Z20828 Contact with and (suspected) exposure to other viral communicable diseases: Secondary | ICD-10-CM

## 2019-06-20 DIAGNOSIS — R059 Cough, unspecified: Secondary | ICD-10-CM

## 2019-06-20 DIAGNOSIS — Z20822 Contact with and (suspected) exposure to covid-19: Secondary | ICD-10-CM

## 2019-06-20 MED ORDER — AMOXICILLIN-POT CLAVULANATE 875-125 MG PO TABS: 1.0000 | ORAL_TABLET | Freq: Two times a day (BID) | ORAL | 0 refills | Status: DC

## 2019-06-20 MED ORDER — ALBUTEROL SULFATE HFA 108 (90 BASE) MCG/ACT IN AERS: 2.0000 | INHALATION_SPRAY | Freq: Four times a day (QID) | RESPIRATORY_TRACT | 1 refills | Status: DC | PRN

## 2019-06-20 NOTE — Progress Notes (Signed)
Virtual Visit via Telephone Note  I connected with Dylan Houston on 06/20/19 at  1:00 PM EDT by telephone and verified that I am speaking with the correct person using two identifiers.   I discussed the limitations, risks, security and privacy concerns of performing an evaluation and management service by telephone and the availability of in person appointments. I also discussed with the patient that there may be a patient responsible charge related to this service. The patient expressed understanding and agreed to proceed.  Location patient: home Location provider: work or home office Participants present for the call: patient, provider Patient did not have a visit in the prior 7 days to address this/these issue(s).   History of Present Illness:  Acute visit for "cough and congestion": -symptoms started a little over one week ago -cough, sinus congestion, L ear pressure - stopped up, sinus pain - L maxillary today, lots of drainage from the sinuses into the throat - greenish yellow sinus drainage -denies SOB, CP, fever, diarrhea, vomiting, body aches - not stuck in bed can eat and drink -PMH: denies hx of lung disease as an adult (had childhood asthma), immunocompromised conditions - reports PCP gave him albuterol in the past for ? Emphysema but has not used in some time and requests refill -grandson has the same symptoms, no COVID19 or other exposures, grandson is home always     Observations/Objective: Patient sounds cheerful and well on the phone. I do not appreciate any SOB. Speech and thought processing are grossly intact. Patient reported vitals:  Assessment and Plan:  Sinus congestion Cough  -we discussed possible serious and likely etiologies, options for evaluation and workup, limitations of telemedicine visit vs in person visit, treatment, treatment risks and precautions. Pt prefers to treat via telemedicine empirically rather then risking or undertaking an in person  visit at this moment. Query developing sinusitis most likely vs VRUI vs COVID19 vs other. He opted for treatment with Augmenting, COVID testing, home isolation, refilled albuterol, symptomatic care and agrees to seek prompt in person care if worsening, new symptoms arise, or if is not improving with treatment. He also appear to be due for routine follow up with his PCP and sent message to schedulers to assist.     Follow Up Instructions:   I did not refer this patient for an OV in the next 24 hours for this/these issue(s).  I discussed the assessment and treatment plan with the patient. The patient was provided an opportunity to ask questions and all were answered. The patient agreed with the plan and demonstrated an understanding of the instructions.   The patient was advised to call back or seek an in-person evaluation if the symptoms worsen or if the condition fails to improve as anticipated.  I provided 15 minutes of non-face-to-face time during this encounter.   Lucretia Kern, DO    Patient Instructions   -I sent the medication(s) we discussed to your pharmacy:  Meds ordered this encounter  Medications  . amoxicillin-clavulanate (AUGMENTIN) 875-125 MG tablet    Sig: Take 1 tablet by mouth 2 (two) times daily.    Dispense:  20 tablet    Refill:  0  . albuterol (VENTOLIN HFA) 108 (90 Base) MCG/ACT inhaler    Sig: Inhale 2 puffs into the lungs every 6 (six) hours as needed for wheezing or shortness of breath.    Dispense:  8.5 g    Refill:  1    Please let us know if  you have any questions or concerns regarding this prescription.  I hope you are feeling better soon! Seek care promptly if your symptoms worsen, new concerns arise or you are not improving with treatment.  Follow up: as needed for these symptoms if any worsening, new symptoms or if you are not improving with the treatment. You are due for routine follow up and your annual exam with your primary care physician.  Please schedule this visit once you are feeling better.  Self Isolation/Home Quarantine: -see the CDC site for information:   DiscoHelp.sihttps://www.cdc.gov/coronavirus/2019-ncov/if-you-are-sick/steps-when-sick.html   -STAY HOME except for to seek medical care -stay in your own room away from others in your house and use a separate bathroom if possible -Wash hands frequently, disinfect high touch surface areas often, wear a mask if you leave your room and interact as little as possible with others -seek medical care immediately if worsening - call our office for a visit or call ahead if going elsewhere to an urgent care  -seek emergency care if very sick or severe symptoms - call 911 -isolate for at least 10 days from the onset of symptoms PLUS 3 days of no fever PLUS 3 days of improving symptoms  We recommend wearing a mask, social distancing, good hand hygiene and asking others  around you to do the same at all times when around others outside of your home unit throughout the COVID19 pandemic.    Novel Coronavirus Testing: I sent an order for coronavirus testing. No Appointment is needed.  Testing Sites:    [place] Location:                            [address], [place]                                              [facility] Hours:                                 8a-3:45p, M-F  [facility] Location:                           [address], [place], [place] [id number]                                                              [address] [facility] Hours:                                 8a-3:45p, M-F  [facility] Location:                            [facility] (across from [facility]) Hours:                                 8a-3:45p, M-F  Positive test. These  tests are not 100% perfect, but if you tested positive for COVID-19, this confirms that you have contracted the SARS-CoV-2 virus. STAY HOME to  complete full Quarantine per CDC guidelines.  Negative test. These tests are not 100% perfect but if you tested negative for COVID-19, this indicates that you may not have contracted the SARS-CoV-2 virus. Follow your doctor's recommendations and the CDC guidelines.

## 2019-06-20 NOTE — Patient Instructions (Signed)
-I sent the medication(s) we discussed to your pharmacy:  Meds ordered this encounter  Medications  . amoxicillin-clavulanate (AUGMENTIN) 875-125 MG tablet    Sig: Take 1 tablet by mouth 2 (two) times daily.    Dispense:  20 tablet    Refill:  0  . albuterol (VENTOLIN HFA) 108 (90 Base) MCG/ACT inhaler    Sig: Inhale 2 puffs into the lungs every 6 (six) hours as needed for wheezing or shortness of breath.    Dispense:  8.5 g    Refill:  1    Please let us know if you have any questions or concerns regarding this prescription.  I hope you are feeling better soon! Seek care promptly if your symptoms worsen, new concerns arise or you are not improving with treatment.  Follow up: as needed for these symptoms if any worsening, new symptoms or if you are not improving with the treatment. You are due for routine follow up and your annual exam with your primary care physician. Please schedule this visit once you are feeling better.  Self Isolation/Home Quarantine: -see the CDC site for information:   RunningShows.co.za.html   -STAY HOME except for to seek medical care -stay in your own room away from others in your house and use a separate bathroom if possible -Wash hands frequently, disinfect high touch surface areas often, wear a mask if you leave your room and interact as little as possible with others -seek medical care immediately if worsening - call our office for a visit or call ahead if going elsewhere to an urgent care  -seek emergency care if very sick or severe symptoms - call 911 -isolate for at least 10 days from the onset of symptoms PLUS 3 days of no fever PLUS 3 days of improving symptoms  We recommend wearing a mask, social distancing, good hand hygiene and asking others  around you to do the same at all times when around others outside of your home unit throughout the [place] pandemic.    Novel Coronavirus  Testing: I sent an order for coronavirus testing. No Appointment is needed.  Testing Sites:    [place] Location:                            [place], [place] ([facility]) Hours:                                 8a-3:45p, M-F  [facility] Location:                           [address] [place], [place] [address]                                                              [place] ([place]) Hours:  8a-3:45p, M-F  [facility] Location:                            [facility] (across from [facility]/[place]) Hours:                                 8a-3:45p, M-F  Positive test. These tests are not 100% perfect, but if you tested positive for COVID-19, this confirms that you have contracted the SARS-CoV-2 virus. STAY HOME to complete full Quarantine per CDC guidelines.  Negative test. These tests are not 100% perfect but if you tested negative for COVID-19, this indicates that you may not have contracted the SARS-CoV-2 virus. Follow your doctor's recommendations and the CDC guidelines.

## 2019-11-21 ENCOUNTER — Encounter: Payer: Self-pay | Admitting: Family Medicine

## 2019-11-21 ENCOUNTER — Telehealth (INDEPENDENT_AMBULATORY_CARE_PROVIDER_SITE_OTHER): Payer: Self-pay | Admitting: Family Medicine

## 2019-11-21 DIAGNOSIS — L309 Dermatitis, unspecified: Secondary | ICD-10-CM

## 2019-11-21 DIAGNOSIS — K6289 Other specified diseases of anus and rectum: Secondary | ICD-10-CM

## 2019-11-21 MED ORDER — HYDROCORTISONE ACETATE 25 MG RE SUPP: 25.0000 mg | Freq: Two times a day (BID) | RECTAL | 1 refills | Status: DC

## 2019-11-21 MED ORDER — TRIAMCINOLONE ACETONIDE 0.1 % EX CREA: 1.0000 "application " | TOPICAL_CREAM | Freq: Two times a day (BID) | CUTANEOUS | 1 refills | Status: DC

## 2019-11-21 NOTE — Progress Notes (Signed)
   Subjective:    Patient ID: Dylan Houston, male    DOB: August 11, 1966, 54 y.o.   MRN: [id number]  HPI Virtual Visit via Video Note  I connected with the patient on 11/21/19 at  2:30 PM EDT by a video enabled telemedicine application and verified that I am speaking with the correct person using two identifiers.  Location patient: home Location provider:work or home office Persons participating in the virtual visit: patient, provider  I discussed the limitations of evaluation and management by telemedicine and the availability of in person appointments. The patient expressed understanding and agreed to proceed.   HPI: Here for 2 issues. First he has had patches of itchy skin on his arms for several weeks. Also he has had 4 days of pain and irritation around the anus. He has also had drainage of a clear mucu from the anus. No bleeding. His wife has looked at the area and she sees no hemorrhoids. His BMs are normal and easy to pass. No abdominal pain.    ROS: See pertinent positives and negatives per HPI.  Past Medical History:  Diagnosis Date  . Chest pain   . Coronary artery disease   . Hypercholesteremia   . Hypertension   . Panic attack     Past Surgical History:  Procedure Laterality Date  . CARDIAC CATHETERIZATION    . KNEE ARTHROSCOPY W/ MENISCAL REPAIR Left 1995   per [facility]   . ROTATOR CUFF REPAIR Left 1998   per [facility]     Family History  Problem Relation Age of Onset  . Cancer Mother   . Heart disease Father   . Lung cancer Father 63  . Emphysema Unknown   . Cancer Unknown      Current Outpatient Medications:  .  albuterol (VENTOLIN HFA) 108 (90 Base) MCG/ACT inhaler, Inhale 2 puffs into the lungs every 6 (six) hours as needed for wheezing or shortness of breath., Disp: 8.5 g, Rfl: 1 .  hydrocortisone (ANUSOL-HC) 25 MG suppository, Place 1 suppository (25 mg total) rectally 2 (two) times daily., Disp: 12 suppository, Rfl: 1 .  triamcinolone  cream (KENALOG) 0.1 %, Apply 1 application topically 2 (two) times daily., Disp: 45 g, Rfl: 1  EXAM:  VITALS per patient if applicable:  GENERAL: alert, oriented, appears well and in no acute distress  HEENT: atraumatic, conjunttiva clear, no obvious abnormalities on inspection of external nose and ears  NECK: normal movements of the head and neck  LUNGS: on inspection no signs of respiratory distress, breathing rate appears normal, no obvious gross SOB, gasping or wheezing  CV: no obvious cyanosis  MS: moves all visible extremities without noticeable abnormality  PSYCH/NEURO: pleasant and cooperative, no obvious depression or anxiety, speech and thought processing grossly intact  ASSESSMENT AND PLAN: Proctitis, treat with hydrocortisone suppositories. Eczema, treat with Triamcinolone cream.  Gershon Crane, MD  Discussed the following assessment and plan:  No diagnosis found.     I discussed the assessment and treatment plan with the patient. The patient was provided an opportunity to ask questions and all were answered. The patient agreed with the plan and demonstrated an understanding of the instructions.   The patient was advised to call back or seek an in-person evaluation if the symptoms worsen or if the condition fails to improve as anticipated.     Review of Systems     Objective:   Physical Exam        Assessment & Plan:

## 2020-01-12 ENCOUNTER — Other Ambulatory Visit: Payer: Self-pay

## 2020-01-12 ENCOUNTER — Ambulatory Visit (INDEPENDENT_AMBULATORY_CARE_PROVIDER_SITE_OTHER): Payer: Self-pay | Admitting: Family Medicine

## 2020-01-12 ENCOUNTER — Encounter: Payer: Self-pay | Admitting: Family Medicine

## 2020-01-12 VITALS — BP 124/70 | HR 102 | Temp 98.7°F | Wt 197.0 lb

## 2020-01-12 DIAGNOSIS — L03311 Cellulitis of abdominal wall: Secondary | ICD-10-CM

## 2020-01-12 MED ORDER — SULFAMETHOXAZOLE-TRIMETHOPRIM 800-160 MG PO TABS: 1.0000 | ORAL_TABLET | Freq: Two times a day (BID) | ORAL | 0 refills | Status: DC

## 2020-01-12 NOTE — Patient Instructions (Signed)

## 2020-01-12 NOTE — Progress Notes (Signed)
  Subjective:     Patient ID: Dylan Houston, male   DOB: [birth year], 54 y.o.   MRN: [id number]  HPI   Dylan Houston seen with concern for abscess lower abdominal region.  He thinks he may have scratched this area recently.  Last week he noticed some redness and swelling and pain.  2 days ago he started having some drainage from what had been a small yellowish punctate center.  He has applied some heat.  No systemic fever.  He has some surrounding redness.  Previous intolerance with doxycycline.  No known history of MRSA.  No nausea or vomiting.  Denies any recent injury.  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    Past Medical History:  Diagnosis Date  . Chest pain   . Coronary artery disease   . Hypercholesteremia   . Hypertension   . Panic attack    Past Surgical History:  Procedure Laterality Date  . CARDIAC CATHETERIZATION    . KNEE ARTHROSCOPY W/ MENISCAL REPAIR Left 1995   per [facility]   . ROTATOR CUFF REPAIR Left 1998   per [facility]     reports that he has been smoking cigarettes. He has a 34.00 pack-year smoking history. He has never used smokeless tobacco. He reports that he does not drink alcohol or use drugs. family history includes Cancer in his mother and unknown relative; Emphysema in his unknown relative; Heart disease in his father; Lung cancer (age of onset: 20) in his father. Allergies  Allergen Reactions  . Doxycycline Hyclate Diarrhea and Nausea And Vomiting  . Benadryl [Diphenhydramine Hcl] Anxiety     Review of Systems  Constitutional: Negative for chills and fever.  Respiratory: Negative for shortness of breath.   Cardiovascular: Negative for chest pain.  Gastrointestinal: Negative for abdominal distention, nausea and vomiting.  Neurological: Negative for dizziness.  Psychiatric/Behavioral: Negative for confusion.       Objective:   Physical Exam Constitutional:      General: He is  not in acute distress.    Appearance: He is not ill-appearing or toxic-appearing.  Cardiovascular:     Rate and Rhythm: Normal rate and regular rhythm.  Pulmonary:     Effort: Pulmonary effort is normal.     Breath sounds: Normal breath sounds.  Skin:    Comments: He has small superficial open area draining some pus lower abdominal region.  Minimal surrounding induration.  No fluctuance.  He has approximately 8 x 14 cm area of mild surrounding cellulitis type changes  Neurological:     Mental Status: He is alert.        Assessment:     Draining abscess lower abdomen with some surrounding cellulitis changes.  Patient nontoxic and afebrile.  Previous intolerance with doxycycline    Plan:     -Start Septra DS 1 twice daily for 10 days -Follow-up promptly for any fever, progressive redness, or any other concerns -Continue warm compresses several times daily -We wrote for note to be out of work for the next few days until 01/16/2020.  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 -He knows to go to ER over weekend for worsening signs of infection.  Dylan Covey MD [facility]

## 2020-07-08 ENCOUNTER — Other Ambulatory Visit: Payer: Self-pay

## 2020-07-08 ENCOUNTER — Encounter: Payer: Self-pay | Admitting: Family Medicine

## 2020-07-08 ENCOUNTER — Ambulatory Visit (INDEPENDENT_AMBULATORY_CARE_PROVIDER_SITE_OTHER): Payer: Self-pay | Admitting: Family Medicine

## 2020-07-08 VITALS — BP 126/76 | HR 96 | Temp 98.4°F | Wt 206.4 lb

## 2020-07-08 DIAGNOSIS — L732 Hidradenitis suppurativa: Secondary | ICD-10-CM

## 2020-07-08 DIAGNOSIS — L0291 Cutaneous abscess, unspecified: Secondary | ICD-10-CM

## 2020-07-08 MED ORDER — SULFAMETHOXAZOLE-TRIMETHOPRIM 800-160 MG PO TABS: 1.0000 | ORAL_TABLET | Freq: Two times a day (BID) | ORAL | 0 refills | Status: AC

## 2020-07-08 NOTE — Patient Instructions (Signed)
Hidradenitis Suppurativa Hidradenitis suppurativa is a long-term (chronic) skin disease. It is similar to a severe form of acne, but it affects areas of the body where acne would be unusual, especially areas of the body where skin rubs against skin and becomes moist. These include:  Underarms.  Groin.  Genital area.  Buttocks.  Upper thighs.  Breasts. Hidradenitis suppurativa may start out as small lumps or pimples caused by blocked sweat glands or hair follicles. Pimples may develop into deep sores that break open (rupture) and drain pus. Over time, affected areas of skin may thicken and become scarred. This condition is rare and does not spread from person to person (non-contagious). What are the causes? The exact cause of this condition is not known. It may be related to:  Male and male hormones.  An overactive disease-fighting system (immune system). The immune system may over-react to blocked hair follicles or sweat glands and cause swelling and pus-filled sores. What increases the risk? You are more likely to develop this condition if you:  Are male.  Are 32-26 years old.  Have a family history of hidradenitis suppurativa.  Have a personal history of acne.  Are overweight.  Smoke.  Take the medicine lithium. What are the signs or symptoms? The first symptoms are usually painful bumps in the skin, similar to pimples. The condition may get worse over time (progress), or it may only cause mild symptoms. If the disease progresses, symptoms may include:  Skin bumps getting bigger and growing deeper into the skin.  Bumps rupturing and draining pus.  Itchy, infected skin.  Skin getting thicker and scarred.  Tunnels under the skin (fistulas) where pus drains from a bump.  Pain during daily activities, such as pain during walking if your groin area is affected.  Emotional problems, such as stress or depression. This condition may affect your appearance and your  ability or willingness to wear certain clothes or do certain activities. How is this diagnosed? This condition is diagnosed by a health care provider who specializes in skin diseases (dermatologist). You may be diagnosed based on:  Your symptoms and medical history.  A physical exam.  Testing a pus sample for infection.  Blood tests. How is this treated? Your treatment will depend on how severe your symptoms are. The same treatment will not work for everybody with this condition. You may need to try several treatments to find what works best for you. Treatment may include:  Cleaning and bandaging (dressing) your wounds as needed.  Lifestyle changes, such as new skin care routines.  Taking medicines, such as: ? Antibiotics. ? Acne medicines. ? Medicines to reduce the activity of the immune system. ? A diabetes medicine (metformin). ? Birth control pills, for women. ? Steroids to reduce swelling and pain.  Working with a mental health care provider, if you experience emotional distress due to this condition. If you have severe symptoms that do not get better with medicine, you may need surgery. Surgery may involve:  Using a laser to clear the skin and remove hair follicles.  Opening and draining deep sores.  Removing the areas of skin that are diseased and scarred. Follow these instructions at home: Medicines   Take over-the-counter and prescription medicines only as told by your health care provider.  If you were prescribed an antibiotic medicine, take it as told by your health care provider. Do not stop taking the antibiotic even if your condition improves. Skin care  If you have open wounds, cover  them with a clean dressing as told by your health care provider. Keep wounds clean by washing them gently with soap and water when you bathe.  Do not shave the areas where you get hidradenitis suppurativa.  Do not wear deodorant.  Wear loose-fitting clothes.  Try to avoid  getting overheated or sweaty. If you get sweaty or wet, change into clean, dry clothes as soon as you can.  To help relieve pain and itchiness, cover sore areas with a warm, clean washcloth (warm compress) for 5-10 minutes as often as needed.  If told by your health care provider, take a bleach bath twice a week: ? Fill your bathtub halfway with water. ? Pour in  cup of unscented household bleach. ? Soak in the tub for 5-10 minutes. ? Only soak from the neck down. Avoid water on your face and hair. ? Shower to rinse off the bleach from your skin. General instructions  Learn as much as you can about your disease so that you have an active role in your treatment. Work closely with your health care provider to find treatments that work for you.  If you are overweight, work with your health care provider to lose weight as recommended.  Do not use any products that contain nicotine or tobacco, such as cigarettes and e-cigarettes. If you need help quitting, ask your health care provider.  If you struggle with living with this condition, talk with your health care provider or work with a mental health care provider as recommended.  Keep all follow-up visits as told by your health care provider. This is important. Where to find more information  Hidradenitis Suppurativa [employer].: https://www.hs-foundation.org/ Contact a health care provider if you have:  A flare-up of hidradenitis suppurativa.  A fever or chills.  Trouble controlling your symptoms at home.  Trouble doing your daily activities because of your symptoms.  Trouble dealing with emotional problems related to your condition. Summary  Hidradenitis suppurativa is a long-term (chronic) skin disease. It is similar to a severe form of acne, but it affects areas of the body where acne would be unusual.  The first symptoms are usually painful bumps in the skin, similar to pimples. The condition may get worse over time  (progress), or it may only cause mild symptoms.  If you have open wounds, cover them with a clean dressing as told by your health care provider. Keep wounds clean by washing them gently with soap and water when you bathe.  Besides skin care, treatment may include medicines, laser treatment, and surgery. This information is not intended to replace advice given to you by your health care provider. Make sure you discuss any questions you have with your health care provider. Document Revised: 08/18/2017 Document Reviewed: 08/18/2017 Elsevier Patient Education  2020 [employer]  Skin Abscess  A skin abscess is an infected area of your skin that contains pus and other material. An abscess can happen in any part of your body. Some abscesses break open (rupture) on their own. Most continue to get worse unless they are treated. The infection can spread deeper into the body and into your blood, which can make you feel sick. A skin abscess is caused by germs that enter the skin through a cut or scrape. It can also be caused by blocked oil and sweat glands or infected hair follicles. This condition is usually treated by:  Draining the pus.  Taking antibiotic medicines.  Placing a warm, wet washcloth over the abscess.  Follow these instructions at home: Medicines   Take over-the-counter and prescription medicines only as told by your doctor.  If you were prescribed an antibiotic medicine, take it as told by your doctor. Do not stop taking the antibiotic even if you start to feel better. Abscess care   If you have an abscess that has not drained, place a warm, clean, wet washcloth over the abscess several times a day. Do this as told by your doctor.  Follow instructions from your doctor about how to take care of your abscess. Make sure you: ? Cover the abscess with a bandage (dressing). ? Change your bandage or gauze as told by your doctor. ? Wash your hands with soap and water before you  change the bandage or gauze. If you cannot use soap and water, use hand sanitizer.  Check your abscess every day for signs that the infection is getting worse. Check for: ? More redness, swelling, or pain. ? More fluid or blood. ? Warmth. ? More pus or a bad smell. General instructions  To avoid spreading the infection: ? Do not share personal care items, towels, or hot tubs with others. ? Avoid making skin-to-skin contact with other people.  Keep all follow-up visits as told by your doctor. This is important. Contact a doctor if:  You have more redness, swelling, or pain around your abscess.  You have more fluid or blood coming from your abscess.  Your abscess feels warm when you touch it.  You have more pus or a bad smell coming from your abscess.  You have a fever.  Your muscles ache.  You have chills.  You feel sick. Get help right away if:  You have very bad (severe) pain.  You see red streaks on your skin spreading away from the abscess. Summary  A skin abscess is an infected area of your skin that contains pus and other material.  The abscess is caused by germs that enter the skin through a cut or scrape. It can also be caused by blocked oil and sweat glands or infected hair follicles.  Follow your doctor's instructions on caring for your abscess, taking medicines, preventing infections, and keeping follow-up visits. This information is not intended to replace advice given to you by your health care provider. Make sure you discuss any questions you have with your health care provider. Document Revised: 03/16/2019 Document Reviewed: 09/23/2017 Elsevier Patient Education  2020 [employer] Inc.  Incision and Drainage, Care After This sheet gives you information about how to care for yourself after your procedure. Your health care provider may also give you more specific instructions. If you have problems or questions, contact your health care provider. What can I  expect after the procedure? After the procedure, it is common to have:  Pain or discomfort around the incision site.  Blood, fluid, or pus (drainage) from the incision.  Redness and firm skin around the incision site. Follow these instructions at home: Medicines  Take over-the-counter and prescription medicines only as told by your health care provider.  If you were prescribed an antibiotic medicine, use or take it as told by your health care provider. Do not stop using the antibiotic even if you start to feel better. Wound care Follow instructions from your health care provider about how to take care of your wound. Make sure you:  Wash your hands with soap and water before and after you change your bandage (dressing). If soap and water are not available, use hand  sanitizer.  Change your dressing and packing as told by your health care provider. ? If your dressing is dry or stuck when you try to remove it, moisten or wet the dressing with saline or water so that it can be removed without harming your skin or tissues. ? If your wound is packed, leave it in place until your health care provider tells you to remove it. To remove the packing, moisten or wet the packing with saline or water so that it can be removed without harming your skin or tissues.  Leave stitches (sutures), skin glue, or adhesive strips in place. These skin closures may need to stay in place for 2 weeks or longer. If adhesive strip edges start to loosen and curl up, you may trim the loose edges. Do not remove adhesive strips completely unless your health care provider tells you to do that. Check your wound every day for signs of infection. Check for:  More redness, swelling, or pain.  More fluid or blood.  Warmth.  Pus or a bad smell. If you were sent home with a drain tube in place, follow instructions from your health care provider about:  How to empty it.  How to care for it at home.  General  instructions  Rest the affected area.  Do not take baths, swim, or use a hot tub until your health care provider approves. Ask your health care provider if you may take showers. You may only be allowed to take sponge baths.  Return to your normal activities as told by your health care provider. Ask your health care provider what activities are safe for you. Your health care provider may put you on activity or lifting restrictions.  The incision will continue to drain. It is normal to have some clear or slightly bloody drainage. The amount of drainage should lessen each day.  Do not apply any creams, ointments, or liquids unless you have been told to by your health care provider.  Keep all follow-up visits as told by your health care provider. This is important. Contact a health care provider if:  Your cyst or abscess returns.  You have a fever or chills.  You have more redness, swelling, or pain around your incision.  You have more fluid or blood coming from your incision.  Your incision feels warm to the touch.  You have pus or a bad smell coming from your incision.  You have red streaks above or below the incision site. Get help right away if:  You have severe pain or bleeding.  You cannot eat or drink without vomiting.  You have decreased urine output.  You become short of breath.  You have chest pain.  You cough up blood.  The affected area becomes numb or starts to tingle. These symptoms may represent a serious problem that is an emergency. Do not wait to see if the symptoms will go away. Get medical help right away. Call your local emergency services (911 in the [place] Do not drive yourself to the hospital. Summary  After this procedure, it is common to have fluid, blood, or pus coming from the surgery site.  Follow all home care instructions. You will be told how to take care of your incision, how to check for infection, and how to take medicines.  If you were  prescribed an antibiotic medicine, take it as told by your health care provider. Do not stop taking the antibiotic even if you start to feel better.  Contact a health care provider if you have increased redness, swelling, or pain around your incision. Get help right away if you have chest pain, you vomit, you cough up blood, or you have shortness of breath.  Keep all follow-up visits as told by your health care provider. This is important. This information is not intended to replace advice given to you by your health care provider. Make sure you discuss any questions you have with your health care provider. Document Revised: 07/11/2018 Document Reviewed: 07/11/2018 Elsevier Patient Education  2020 [employer].

## 2020-07-08 NOTE — Progress Notes (Signed)
Subjective:    Patient ID: UNIQUE SEARFOSS, male    DOB: 1966-01-07, 54 y.o.   MRN: [id number]  No chief complaint on file.   HPI Patient was seen today for acute concern.  Patient endorses pain/rash in lower abdomen.  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  Pt denies h/o allergy to metals, fever, chills, nausea, vomiting.  Pt had similar sx in May 2021.  Pt endorses tobacco use.  Past Medical History:  Diagnosis Date  . Chest pain   . Coronary artery disease   . Hypercholesteremia   . Hypertension   . Panic attack     Allergies  Allergen Reactions  . Doxycycline Hyclate Diarrhea and Nausea And Vomiting  . Benadryl [Diphenhydramine Hcl] Anxiety    ROS General: Denies fever, chills, night sweats, changes in weight, changes in appetite HEENT: Denies headaches, ear pain, changes in vision, rhinorrhea, sore throat CV: Denies CP, palpitations, SOB, orthopnea Pulm: Denies SOB, cough, wheezing GI: Denies abdominal pain, nausea, vomiting, diarrhea, constipation GU: Denies dysuria, hematuria, frequency Msk: Denies muscle cramps, joint pains Neuro: Denies weakness, numbness, tingling Skin: Denies rashes, bruising  +rash on abdomen. Psych: Denies depression, anxiety, hallucinations     Objective:    Blood pressure 126/76, pulse 96, temperature 98.4 F (36.9 C), temperature source Oral, weight 206 lb 6.4 oz (93.6 kg), SpO2 99 %.   Gen. Pleasant, well-nourished, in no distress, normal affect   HEENT: [place]/AT, face symmetric, conjunctiva clear, no scleral icterus, PERRLA, EOMI, nares patent without drainage Lungs: no accessory muscle use Cardiovascular: RRR, no peripheral edema Abdomen: BS present, soft, NT/ND Musculoskeletal: No deformities, no cyanosis or clubbing, normal tone Neuro:  A&Ox3, CN II-XII intact, normal gait Skin:  Warm, dry, intact.  Lower abdomen with small fluctuant area surrounded by erythema, no induration.  I&D performed.   Several well-healed openings in skin of lower abdomen.   Wt Readings from Last 3 Encounters:  07/08/20 206 lb 6.4 oz (93.6 kg)  01/12/20 197 lb (89.4 kg)  04/11/18 175 lb (79.4 kg)    Lab Results  Component Value Date   WBC 9.0 08/24/2016   HGB 13.5 08/24/2016   HCT 40.8 08/24/2016   PLT 238 08/24/2016   GLUCOSE 97 08/24/2016   CHOL 144 08/23/2016   TRIG 106 08/23/2016   HDL 32 (L) 08/23/2016   LDLCALC 91 08/23/2016   ALT 11 (L) 08/22/2016   AST 17 08/22/2016   NA 139 08/24/2016   K 4.0 08/24/2016   CL 106 08/24/2016   CREATININE 0.56 (L) 08/24/2016   BUN 11 08/24/2016   CO2 25 08/24/2016   TSH 1.73 01/04/2008   INR 0.98 08/22/2016    Incision and Drainage Procedure Note  Pre-operative Diagnosis: Abscess  Post-operative Diagnosis: same  Indications: Abscess  Anesthesia: 1% plain lidocaine  Procedure Details  The procedure, risks and complications have been discussed in detail (including, but not limited to airway compromise, infection, bleeding) with the patient, and the patient has signed consent to the procedure.  The skin was sterilely prepped and draped over the affected area in the usual fashion. After adequate local anesthesia, I&D with a #11 blade was performed on the midline lower abdomen. Purulent drainage: present The patient was observed until stable.  EBL: minimal cc's  Condition: Tolerated procedure well and Stable   Complications: none  Assessment/Plan:  Hidradenitis suppurativa  -several well healed openings in skin likely from prior cyst/abscesses present in skin of  lower abdomen. -Consent obtained.  I&D performed.  Patient tolerated procedure well. -discussed supportive care.  NSAIDs prn for pain/discomfort, warm compresses -will start Bactrim.  Allergy to Doxycycline. -Smoking cessation advised. -given precautions for contnued of worsened symptoms -consider f/u with Derm -given handout - Plan: sulfamethoxazole-trimethoprim (BACTRIM  DS) 800-160 MG tablet  Abscess  - Plan: sulfamethoxazole-trimethoprim (BACTRIM DS) 800-160 MG tablet  F/u prn  Abbe Amsterdam, MD

## 2020-07-09 ENCOUNTER — Ambulatory Visit: Payer: Self-pay | Admitting: Family Medicine

## 2020-07-16 ENCOUNTER — Encounter: Payer: Self-pay | Admitting: Family Medicine

## 2020-09-06 ENCOUNTER — Other Ambulatory Visit: Payer: Self-pay

## 2020-11-27 ENCOUNTER — Ambulatory Visit: Payer: Self-pay | Admitting: Family Medicine

## 2020-12-11 ENCOUNTER — Ambulatory Visit: Payer: Self-pay | Admitting: Family Medicine

## 2021-01-08 ENCOUNTER — Institutional Professional Consult (permissible substitution): Payer: Self-pay | Admitting: Pulmonary Disease

## 2021-01-08 ENCOUNTER — Other Ambulatory Visit: Payer: Self-pay

## 2021-03-04 ENCOUNTER — Encounter: Payer: Self-pay | Admitting: Pulmonary Disease

## 2021-03-04 ENCOUNTER — Ambulatory Visit: Payer: 59 | Admitting: Pulmonary Disease

## 2021-03-04 ENCOUNTER — Other Ambulatory Visit: Payer: Self-pay

## 2021-03-04 VITALS — BP 130/84 | HR 100 | Ht 67.0 in | Wt 204.0 lb

## 2021-03-04 DIAGNOSIS — G4733 Obstructive sleep apnea (adult) (pediatric): Secondary | ICD-10-CM | POA: Diagnosis not present

## 2021-03-04 DIAGNOSIS — Z720 Tobacco use: Secondary | ICD-10-CM

## 2021-03-04 DIAGNOSIS — J4520 Mild intermittent asthma, uncomplicated: Secondary | ICD-10-CM

## 2021-03-04 DIAGNOSIS — J449 Chronic obstructive pulmonary disease, unspecified: Secondary | ICD-10-CM

## 2021-03-04 MED ORDER — NICOTINE POLACRILEX 2 MG MT GUM: 2.0000 mg | CHEWING_GUM | OROMUCOSAL | 0 refills | Status: DC | PRN

## 2021-03-04 MED ORDER — ALBUTEROL SULFATE HFA 108 (90 BASE) MCG/ACT IN AERS: 2.0000 | INHALATION_SPRAY | Freq: Four times a day (QID) | RESPIRATORY_TRACT | 3 refills | Status: DC | PRN

## 2021-03-04 MED ORDER — NICOTINE 7 MG/24HR TD PT24: 7.0000 mg | MEDICATED_PATCH | Freq: Every day | TRANSDERMAL | 0 refills | Status: DC

## 2021-03-04 NOTE — Patient Instructions (Addendum)
Schedule pFTs Home sleep study  Lung cancer screening program Try nicotine patches & gum Be stronger than your excuses Tips to help you break the habit:  -Talk with your doctor  -Make a plan  -Start with small goals  Tools to aid in quitting smoking:  -Nicotine Replacement Therapy (NRT)  -Non-Nicotine Medications  -Support groups and resources (see below)  This info is to assist you with your smoking cessation journey.  It is not a treatment plan or a substitute for medical advice from your physician.  Resources: [employer] (224)089-1109 for more information about their mobile app, text messaging or online smoking cessation help.  [facility] 1-800-QUIT-NOW for free nicotine replacement therapy resources and helpful information to quit smoking.  [facility] For smoking cessation classes, call (367)058-1690.  Contact your physician to see if you are a candidate for a low-dose CT screening exam.

## 2021-03-04 NOTE — Assessment & Plan Note (Signed)
We  discussed tobacco cessation in detail.  Resources were given to him to reach out to quit line to obtain nicotine patches and gum. If these do not work then we can try Nicotrol inhaler in the future We will refer him to lung cancer screening program

## 2021-03-04 NOTE — Assessment & Plan Note (Signed)
With his history of asthma and dyspnea on exertion, PFTs will be scheduled to check for reversibility.  I am more concerned about COPD given his long history of smoking

## 2021-03-04 NOTE — Assessment & Plan Note (Signed)
We will obtain home sleep test to reassess, he had severe OSA in the past and seems to be symptomatic .  Based on results, will order with a prescription for auto CPAP but given his underlying COPD, he may also need formal attended titration study.  He has tolerated full facemask in the past   The pathophysiology of obstructive sleep apnea , it's cardiovascular consequences & modes of treatment including CPAP were discused with the patient in detail & they evidenced understanding.

## 2021-03-04 NOTE — Progress Notes (Signed)
Subjective:    Patient ID: Dylan Houston, male    DOB: [birth year], 55 y.o.   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 He works 10 to 11-hour shifts.  Epworth sleepiness score is 12 and reports some sleepiness while watching TV, lying down to rest in the afternoons or sitting quietly after lunch. Bedtime is between midnight and 2 AM, sleep latency can be variable up to an hour, and sleeps on his right side with 1 pillow, loud snoring has been noted by his fiance, he reports at least 4-5 nocturnal awakenings and nocturia and is out of bed by 9 AM feeling tired with dryness of mouth and occasional headache There is no history suggestive of cataplexy, sleep paralysis or parasomnias   Reports dyspnea on exertion and occasional wheezing.  Denies frequent chest colds. His father had emphysema and died of lung cancer, mother had breast cancer   Significant tests/ events reviewed NPSG 2007:  AHI 74/hr with desat 60% Auto 2010: optimal pressure 13-14cm.   Past Medical History:  Diagnosis Date   Chest pain    Coronary artery disease    Hypercholesteremia    Hypertension    Panic attack     .psh  Allergies  Allergen Reactions   Doxycycline Hyclate Diarrhea and Nausea And Vomiting   Benadryl [Diphenhydramine Hcl] Anxiety    Social History   Socioeconomic History   Marital status: Single    Spouse name: Not on file   Number of children: Not on file   Years of education: Not on file   Highest education level: Not on file  Occupational History   Occupation: customer service  Tobacco Use    Smoking status: Every Day    Packs/day: 1.00    Years: 34.00    Pack years: 34.00    Types: Cigarettes    Start date: 4   Smokeless tobacco: Never  Substance and Sexual Activity   Alcohol use: No    Alcohol/week: 0.0 standard drinks   Drug use: No   Sexual activity: Not on file  Other Topics Concern   Not on file  Social History Narrative   Not on file   Social Determinants of Health   Financial Resource Strain: Not on file  Food Insecurity: Not on file  Transportation Needs: Not on file  Physical Activity: Not on file  Stress: Not on file  Social Connections: Not on file  Intimate Partner Violence: Not on file     Family History  Problem Relation Age of Onset   Cancer Mother    Heart disease Father    Lung cancer Father 47   Emphysema Unknown    Cancer Unknown      Review of Systems   Constitutional: negative for anorexia, fevers and sweats  Eyes: negative for irritation, redness and visual disturbance  Ears, nose, mouth, throat, and face: negative for earaches, epistaxis, nasal congestion and sore throat  Respiratory: negative for cough, dyspnea on exertion, sputum and wheezing  Cardiovascular: negative for chest pain,  lower extremity edema, orthopnea, palpitations and  syncope  Gastrointestinal: negative for abdominal pain, constipation, diarrhea, melena, nausea and vomiting  Genitourinary:negative for dysuria, frequency and hematuria  Hematologic/lymphatic: negative for bleeding, easy bruising and lymphadenopathy  Musculoskeletal:negative for arthralgias, muscle weakness and stiff joints  Neurological: negative for coordination problems, gait problems, headaches and weakness  Endocrine: negative for diabetic symptoms including polydipsia, polyuria and weight loss     Objective:   Physical Exam  Gen. Pleasant, well-nourished, in no distress, normal affect ENT - no pallor,icterus, no post nasal drip Neck: No JVD, no thyromegaly, no carotid  bruits Lungs: no use of accessory muscles, no dullness to percussion, decreased without rales or rhonchi  Cardiovascular: Rhythm regular, heart sounds  normal, no murmurs or gallops, no peripheral edema Abdomen: soft and non-tender, no hepatosplenomegaly, BS normal. Musculoskeletal: No deformities, no cyanosis or clubbing Neuro:  alert, non focal       Assessment & Plan:

## 2021-04-09 ENCOUNTER — Ambulatory Visit: Payer: 59

## 2021-04-09 ENCOUNTER — Other Ambulatory Visit: Payer: Self-pay

## 2021-04-09 DIAGNOSIS — G4733 Obstructive sleep apnea (adult) (pediatric): Secondary | ICD-10-CM

## 2021-04-11 ENCOUNTER — Telehealth: Payer: Self-pay | Admitting: Pulmonary Disease

## 2021-04-11 DIAGNOSIS — G4733 Obstructive sleep apnea (adult) (pediatric): Secondary | ICD-10-CM

## 2021-04-11 NOTE — Telephone Encounter (Signed)
HST showed severe OSA with AHI 48/ hr Suggest autoCPAP  5-15 cm, mask of choice OV with me/APP in 6 wks after starting  

## 2021-04-14 NOTE — Telephone Encounter (Signed)
Called and spoke with patient to let him know HST results. Patient expressed understanding. Stated that he has used [facility] in the past and would like to use them again. Order has been placed. Advised patient to call back once he physically got his machine to schedule follow up within 31-90 days. Nothing further needed at this time.

## 2021-04-22 ENCOUNTER — Telehealth: Payer: Self-pay | Admitting: Pulmonary Disease

## 2021-04-23 NOTE — Telephone Encounter (Signed)
Order sent to [facility]. Pt made aware.

## 2021-08-27 ENCOUNTER — Ambulatory Visit: Payer: 59 | Admitting: Pulmonary Disease

## 2021-08-28 ENCOUNTER — Other Ambulatory Visit: Payer: Self-pay | Admitting: *Deleted

## 2021-08-28 DIAGNOSIS — F17210 Nicotine dependence, cigarettes, uncomplicated: Secondary | ICD-10-CM

## 2021-08-28 DIAGNOSIS — Z87891 Personal history of nicotine dependence: Secondary | ICD-10-CM

## 2021-09-02 ENCOUNTER — Ambulatory Visit: Payer: 59 | Admitting: Primary Care

## 2021-09-05 ENCOUNTER — Ambulatory Visit (INDEPENDENT_AMBULATORY_CARE_PROVIDER_SITE_OTHER): Payer: 59 | Admitting: Primary Care

## 2021-09-05 ENCOUNTER — Other Ambulatory Visit: Payer: Self-pay

## 2021-09-05 ENCOUNTER — Encounter: Payer: Self-pay | Admitting: Primary Care

## 2021-09-05 VITALS — BP 122/82 | HR 93 | Temp 98.0°F | Ht 68.31 in | Wt 198.8 lb

## 2021-09-05 DIAGNOSIS — J439 Emphysema, unspecified: Secondary | ICD-10-CM

## 2021-09-05 DIAGNOSIS — G4733 Obstructive sleep apnea (adult) (pediatric): Secondary | ICD-10-CM | POA: Diagnosis not present

## 2021-09-05 DIAGNOSIS — Z720 Tobacco use: Secondary | ICD-10-CM

## 2021-09-05 MED ORDER — SPIRIVA RESPIMAT 2.5 MCG/ACT IN AERS: 2.0000 | INHALATION_SPRAY | Freq: Every day | RESPIRATORY_TRACT | 2 refills | Status: DC

## 2021-09-05 MED ORDER — ALBUTEROL SULFATE HFA 108 (90 BASE) MCG/ACT IN AERS: 2.0000 | INHALATION_SPRAY | Freq: Four times a day (QID) | RESPIRATORY_TRACT | 3 refills | Status: DC | PRN

## 2021-09-05 NOTE — Assessment & Plan Note (Addendum)
-   Patient is compliant with CPAP and reports benefit from use. Feels he is not getting enough pressure and still having some breakthrough apnea. Current pressure 5-15cm h20; residual AHI 1.6. We will adjust CPAP pressure setting 10-20cm h20 and placing order for size large F&P simplus mask. Advised he adjust/tightening head gear when laying down. He does have facial hair, may need to try nasal mask with chin strap if airleaks continuing or send for mask desensitization study. FU in 3 months or sooner if needed.

## 2021-09-05 NOTE — Assessment & Plan Note (Signed)
-   Current 1 ppd smoker. Advised he taper amount and pick quit date. Use NRT. Due for LDCT in March 2023.

## 2021-09-05 NOTE — Progress Notes (Signed)
@Patient  ID: , male    DOB: [birth year], 56 y.o.   MRN: [id number]  Chief Complaint  Patient presents with   Follow-up    Cpap compliance    Referring provider: [id number], MD  HPI: 56 year old male, current every day smoker. PMH significant for COPD, OSA. Patient of Dr. 53.   09/05/2021- Interim hx  Patient presents today for follow-up/ OSA. He is doing well. Compliant with CPAP. Feels pressure is not strong enough when he first going to sleep and feels like she is still having some apneas. Mask is riding up. Breathing is better since he was started on CPAP therapy. He does report dyspnea symptoms with inclines. He has an occasional cough. He is still smoking 1ppd. Plans on using nicotine patches. He has not have PFTs yet, these will need to be scheduled. LDCT scheduled for March 2023.   Airview download 07/07/21-09/05/21 Usage 60/21 days (98%); 59 days (97%) > 4 hours Average usage 7 hours 55 mins Pressure 5-15cm h20 (12.8cm h20-95%) Airleaks 28.4L (median) AHI 1.6   Allergies  Allergen Reactions   Doxycycline Hyclate Diarrhea and Nausea And Vomiting   Benadryl [Diphenhydramine Hcl] Anxiety    Immunization History  Administered Date(s) Administered   Influenza Split 06/30/2012, 06/06/2013   Influenza Whole 06/12/2008    Past Medical History:  Diagnosis Date   Chest pain    Coronary artery disease    Hypercholesteremia    Hypertension    Panic attack     Tobacco History: Social History   Tobacco Use  Smoking Status Every Day   Packs/day: 1.00   Years: 34.00   Pack years: 34.00   Types: Cigarettes   Start date: 1978  Smokeless Tobacco Never   Ready to quit: Not Answered Counseling given: Not Answered   Outpatient Medications Prior to Visit  Medication Sig Dispense Refill   hydrocortisone (ANUSOL-HC) 25 MG suppository Place 1 suppository (25 mg total) rectally 2 (two) times daily. (Patient not taking: Reported on 09/05/2021) 12  suppository 1   nicotine (NICODERM CQ - DOSED IN MG/24 HR) 7 mg/24hr patch Place 1 patch (7 mg total) onto the skin daily. (Patient not taking: Reported on 09/05/2021) 28 patch 0   nicotine polacrilex (NICORETTE) 2 MG gum Take 1 each (2 mg total) by mouth as needed for smoking cessation. 100 tablet 0   triamcinolone cream (KENALOG) 0.1 % Apply 1 application topically 2 (two) times daily. (Patient not taking: Reported on 09/05/2021) 45 g 1   albuterol (VENTOLIN HFA) 108 (90 Base) MCG/ACT inhaler Inhale 2 puffs into the lungs every 6 (six) hours as needed for wheezing or shortness of breath. (Patient not taking: Reported on 09/05/2021) 8 g 3   No facility-administered medications prior to visit.    Review of Systems  Review of Systems  Constitutional: Negative.   HENT: Negative.    Respiratory:  Positive for cough and shortness of breath. Negative for chest tightness and wheezing.   Psychiatric/Behavioral: Negative.      Physical Exam  BP 122/82 (BP Location: Right Arm, Cuff Size: Normal)    Pulse 93    Temp 98 F (36.7 C) (Oral)    Ht 5' 8.31" (1.735 m)    Wt 198 lb 12.8 oz (90.2 kg)    SpO2 94%    BMI 29.96 kg/m  Physical Exam Constitutional:      Appearance: Normal appearance.  HENT:     Head: Normocephalic and atraumatic.  Mouth/Throat:     Mouth: Mucous membranes are moist.     Pharynx: Oropharynx is clear.  Cardiovascular:     Rate and Rhythm: Normal rate and regular rhythm.  Pulmonary:     Effort: Pulmonary effort is normal.     Breath sounds: Normal breath sounds.  Musculoskeletal:        General: Normal range of motion.  Skin:    General: Skin is warm and dry.  Neurological:     General: No focal deficit present.     Mental Status: He is alert and oriented to person, place, and time. Mental status is at baseline.  Psychiatric:        Mood and Affect: Mood normal.        Behavior: Behavior normal.        Thought Content: Thought content normal.        Judgment:  Judgment normal.     Lab Results:  CBC    Component Value Date/Time   WBC 9.0 08/24/2016 0524   RBC 4.59 08/24/2016 0524   HGB 13.5 08/24/2016 0524   HCT 40.8 08/24/2016 0524   PLT 238 08/24/2016 0524   MCV 88.9 08/24/2016 0524   MCH 29.4 08/24/2016 0524   MCHC 33.1 08/24/2016 0524   RDW 14.1 08/24/2016 0524   LYMPHSABS 2.3 08/22/2016 1737   MONOABS 0.7 08/22/2016 1737   EOSABS 0.1 08/22/2016 1737   BASOSABS 0.0 08/22/2016 1737    BMET    Component Value Date/Time   NA 139 08/24/2016 0524   K 4.0 08/24/2016 0524   CL 106 08/24/2016 0524   CO2 25 08/24/2016 0524   GLUCOSE 97 08/24/2016 0524   BUN 11 08/24/2016 0524   CREATININE 0.56 (L) 08/24/2016 0524   CALCIUM 8.7 (L) 08/24/2016 0524   GFRNONAA >60 08/24/2016 0524   GFRAA >60 08/24/2016 0524    BNP No results found for: BNP  ProBNP No results found for: PROBNP  Imaging: No results found.   Assessment & Plan:   COPD (chronic obstructive pulmonary disease) with emphysema (HCC) - Daily dyspnea symptoms with exertion and inclines. Intermittent cough. Trial Spiriva 2.22mcg two puffs daily. Needs to schedule PFTs.  Obstructive sleep apnea - Patient is compliant with CPAP and reports benefit from use. Feels he is not getting enough pressure and still having some breakthrough apnea. Current pressure 5-15cm h20; residual AHI 1.6. We will adjust CPAP pressure setting 10-20cm h20 and placing order for size large F&P simplus mask. Advised he adjust/tightening head gear when laying down. He does have facial hair, may need to try nasal mask with chin strap if airleaks continuing or send for mask desensitization study. FU in 3 months or sooner if needed.   Tobacco abuse - Current 1 ppd smoker. Advised he taper amount and pick quit date. Use NRT. Due for LDCT in March 2023.    Glenford Bayley, NP 09/05/2021

## 2021-09-05 NOTE — Patient Instructions (Addendum)
Recommendation: Use albuterol 2 puffs every 4-6 hours for breakthrough shortness of breath/wheezing  Taper amount you are smoking and pick quit date (use nicotine patches to help) Continue to wear CPAP every night 4-6 hours  If pressure changes do not help please call me   Orders: DME order for size large F&P simplus full face mask Change CPAP pressure 10-20cm h20  Follow-up: PFTs in March and OV with Dr. Vassie Loll    Steps to Quit Smoking Smoking tobacco is the leading cause of preventable death. It can affect almost every organ in the body. Smoking puts you and those around you at risk for developing many serious chronic diseases. Quitting smoking can be difficult, but it is one of the best things that you can do for your health. It is never too late to quit. How do I get ready to quit? When you decide to quit smoking, create a plan to help you succeed. Before you quit: Pick a date to quit. Set a date within the next 2 weeks to give you time to prepare. Write down the reasons why you are quitting. Keep this list in places where you will see it often. Tell your family, friends, and co-workers that you are quitting. Support from your loved ones can make quitting easier. Talk with your health care provider about your options for quitting smoking. Find out what treatment options are covered by your health insurance. Identify people, places, things, and activities that make you want to smoke (triggers). Avoid them. What first steps can I take to quit smoking? Throw away all cigarettes at home, at work, and in your car. Throw away smoking accessories, such as Set designer. Clean your car. Make sure to empty the ashtray. Clean your home, including curtains and carpets. What strategies can I use to quit smoking? Talk with your health care provider about combining strategies, such as taking medicines while you are also receiving in-person counseling. Using these two strategies together  makes you more likely to succeed in quitting than if you used either strategy on its own. If you are pregnant or breastfeeding, talk with your health care provider about finding counseling or other support strategies to quit smoking. Do not take medicine to help you quit smoking unless your health care provider tells you to do so. To quit smoking: Quit right away Quit smoking completely, instead of gradually reducing how much you smoke over a period of time. Research shows that stopping smoking right away is more successful than gradually quitting. Attend in-person counseling to help you build problem-solving skills. You are more likely to succeed in quitting if you attend counseling sessions regularly. Even short sessions of 10 minutes can be effective. Take medicine You may take medicines to help you quit smoking. Some medicines require a prescription and some you can purchase over-the-counter. Medicines may have nicotine in them to replace the nicotine in cigarettes. Medicines may: Help to stop cravings. Help to relieve withdrawal symptoms. Your health care provider may recommend: Nicotine patches, gum, or lozenges. Nicotine inhalers or sprays. Non-nicotine medicine that is taken by mouth. Find resources Find resources and support systems that can help you to quit smoking and remain smoke-free after you quit. These resources are most helpful when you use them often. They include: Online chats with a Veterinary surgeon. Telephone quitlines. Printed Materials engineer. Support groups or group counseling. Text messaging programs. Mobile phone apps or applications. Use apps that can help you stick to your quit plan by providing reminders,  tips, and encouragement. There are many free apps for mobile devices as well as websites. Examples include Quit Guide from the [employer] and smokefree.gov What things can I do to make it easier to quit?  Reach out to your family and friends for support and encouragement.  Call telephone quitlines (1-800-QUIT-NOW), reach out to support groups, or work with a counselor for support. Ask people who smoke to avoid smoking around you. Avoid places that trigger you to smoke, such as bars, parties, or smoke-break areas at work. Spend time with people who do not smoke. Lessen the stress in your life. Stress can be a smoking trigger for some people. To lessen stress, try: Exercising regularly. Doing deep-breathing exercises. Doing yoga. Meditating. Performing a body scan. This involves closing your eyes, scanning your body from head to toe, and noticing which parts of your body are particularly tense. Try to relax the muscles in those areas. How will I feel when I quit smoking? Day 1 to 3 weeks Within the first 24 hours of quitting smoking, you may start to feel withdrawal symptoms. These symptoms are usually most noticeable 2-3 days after quitting, but they usually do not last for more than 2-3 weeks. You may experience these symptoms: Mood swings. Restlessness, anxiety, or irritability. Trouble concentrating. Dizziness. Strong cravings for sugary foods and nicotine. Mild weight gain. Constipation. Nausea. Coughing or a sore throat. Changes in how the medicines that you take for unrelated issues work in your body. Depression. Trouble sleeping (insomnia). Week 3 and afterward After the first 2-3 weeks of quitting, you may start to notice more positive results, such as: Improved sense of smell and taste. Decreased coughing and sore throat. Slower heart rate. Lower blood pressure. Clearer skin. The ability to breathe more easily. Fewer sick days. Quitting smoking can be very challenging. Do not get discouraged if you are not successful the first time. Some people need to make many attempts to quit before they achieve long-term success. Do your best to stick to your quit plan, and talk with your health care provider if you have any questions or  concerns. Summary Smoking tobacco is the leading cause of preventable death. Quitting smoking is one of the best things that you can do for your health. When you decide to quit smoking, create a plan to help you succeed. Quit smoking right away, not slowly over a period of time. When you start quitting, seek help from your health care provider, family, or friends. This information is not intended to replace advice given to you by your health care provider. Make sure you discuss any questions you have with your health care provider. Document Revised: 04/18/2021 Document Reviewed: 10/29/2018 Elsevier Patient Education  2022 [employer].

## 2021-09-05 NOTE — Assessment & Plan Note (Addendum)
-   Daily dyspnea symptoms with exertion and inclines. Intermittent cough. Trial Spiriva 2.67mcg two puffs daily. Needs to schedule PFTs.

## 2021-09-11 ENCOUNTER — Ambulatory Visit (INDEPENDENT_AMBULATORY_CARE_PROVIDER_SITE_OTHER): Payer: 59 | Admitting: Family Medicine

## 2021-09-11 ENCOUNTER — Encounter: Payer: Self-pay | Admitting: Family Medicine

## 2021-09-11 VITALS — BP 128/78 | HR 89 | Temp 98.6°F | Wt 196.0 lb

## 2021-09-11 DIAGNOSIS — J40 Bronchitis, not specified as acute or chronic: Secondary | ICD-10-CM

## 2021-09-11 MED ORDER — AMOXICILLIN-POT CLAVULANATE 875-125 MG PO TABS: 1.0000 | ORAL_TABLET | Freq: Two times a day (BID) | ORAL | 0 refills | Status: DC

## 2021-09-11 MED ORDER — ALPRAZOLAM 1 MG PO TABS: 1.0000 mg | ORAL_TABLET | Freq: Two times a day (BID) | ORAL | 0 refills | Status: DC | PRN

## 2021-09-11 NOTE — Progress Notes (Signed)
° °  Subjective:    Patient ID: Dylan Houston, male    DOB: 02/21/1966, 56 y.o.   MRN: [id number]  HPI Here for one week of chest tightness and coughing up yellow sputum. He had a fever of 100 degrees initially but this went away. He is mildly SOB, but his inhalers help. No ST or NVD. Drinking fluids.    Review of Systems  Constitutional:  Positive for fever.  HENT: Negative.    Eyes: Negative.   Respiratory:  Positive for cough, chest tightness and shortness of breath. Negative for wheezing.   Cardiovascular: Negative.       Objective:   Physical Exam Constitutional:      Appearance: Normal appearance. He is not ill-appearing.  HENT:     Right Ear: Tympanic membrane, ear canal and external ear normal.     Left Ear: Tympanic membrane, ear canal and external ear normal.     Nose: Nose normal.     Mouth/Throat:     Pharynx: Oropharynx is clear.  Eyes:     Conjunctiva/sclera: Conjunctivae normal.  Cardiovascular:     Rate and Rhythm: Normal rate and regular rhythm.     Pulses: Normal pulses.     Heart sounds: Normal heart sounds.  Pulmonary:     Effort: Pulmonary effort is normal. No respiratory distress.     Breath sounds: Rhonchi present. No wheezing or rales.  Lymphadenopathy:     Cervical: No cervical adenopathy.  Neurological:     Mental Status: He is alert.          Assessment & Plan:  Bronchitis, treat with Augmentin. Add Mucinex as needed. Gershon Crane, MD

## 2021-11-04 ENCOUNTER — Other Ambulatory Visit: Payer: Self-pay

## 2021-11-04 ENCOUNTER — Ambulatory Visit: Payer: 59 | Admitting: Pulmonary Disease

## 2021-11-05 ENCOUNTER — Other Ambulatory Visit: Payer: Self-pay

## 2021-11-05 ENCOUNTER — Encounter: Payer: Self-pay | Admitting: Acute Care

## 2021-11-05 ENCOUNTER — Ambulatory Visit (INDEPENDENT_AMBULATORY_CARE_PROVIDER_SITE_OTHER): Payer: 59 | Admitting: Acute Care

## 2021-11-05 DIAGNOSIS — F17210 Nicotine dependence, cigarettes, uncomplicated: Secondary | ICD-10-CM | POA: Diagnosis not present

## 2021-11-05 NOTE — Progress Notes (Signed)
Virtual Visit via Video Note ? ?I connected with Dylan Houston on 11/05/21 at  9:00 AM EDT by a video enabled telemedicine application and verified that I am speaking with the correct person using two identifiers. ? ?Location: ?Patient:  At home ?Provider:  [facility], [place], [place], [address]  ?  ?I discussed the limitations of evaluation and management by telemedicine and the availability of in person appointments. The patient expressed understanding and agreed to proceed. ? ? ?Shared Decision Making Visit Lung Cancer Screening Program ?[phone]) ? ? ?Eligibility: ?Age 56 y.o. ?Pack Years Smoking History Calculation 77 pack year smoking history ?(# packs/per year x # years smoked) ?Recent History of coughing up blood  no ?Unexplained weight loss? no ?( >Than 15 pounds within the last 6 months ) ?Prior History Lung / other cancer no ?(Diagnosis within the last 5 years already requiring surveillance chest CT Scans). ?Smoking Status Current Smoker ?Former Smokers: Years since quit:  NA ? Quit Date:  NA ? ?Visit Components: ?Discussion included one or more decision making aids. yes ?Discussion included risk/benefits of screening. yes ?Discussion included potential follow up diagnostic testing for abnormal scans. yes ?Discussion included meaning and risk of over diagnosis. yes ?Discussion included meaning and risk of False Positives. yes ?Discussion included meaning of total radiation exposure. yes ? ?Counseling Included: ?Importance of adherence to annual lung cancer LDCT screening. yes ?Impact of comorbidities on ability to participate in the program. yes ?Ability and willingness to under diagnostic treatment. yes ? ?Smoking Cessation Counseling: ?Current Smokers:  ?Discussed importance of smoking cessation. yes ?Information about tobacco cessation classes and interventions provided to patient. yes ?Patient provided with "ticket" for LDCT Scan. yes ?Symptomatic Patient. no ? Counseling NA ?Diagnosis Code:  Tobacco Use Z72.0 ?Asymptomatic Patient yes ? Counseling (Intermediate counseling: > three minutes counseling) [id number] ?Former Smokers:  ?Discussed the importance of maintaining cigarette abstinence. yes ?Diagnosis Code: Personal History of Nicotine Dependence. [id number] ?Information about tobacco cessation classes and interventions provided to patient. Yes ?Patient provided with "ticket" for LDCT Scan. yes ?Written Order for Lung Cancer Screening with LDCT placed in Epic. Yes ?(CT Chest Lung Cancer Screening Low Dose W/O CM) [id number] ?Z12.2-Screening of respiratory organs ?Z87.891-Personal history of nicotine dependence ? ?I have spent 25 minutes of face to face/ virtual visit   time with  [name] discussing the risks and benefits of lung cancer screening. We viewed / discussed a power point together that explained in detail the above noted topics. We paused at intervals to allow for questions to be asked and answered to ensure understanding.We discussed that the single most powerful action that he can take to decrease his risk of developing lung cancer is to quit smoking. We discussed whether or not he is ready to commit to setting a quit date. We discussed options for tools to aid in quitting smoking including nicotine replacement therapy, non-nicotine medications, support groups, Quit Smart classes, and behavior modification. We discussed that often times setting smaller, more achievable goals, such as eliminating 1 cigarette a day for a week and then 2 cigarettes a day for a week can be helpful in slowly decreasing the number of cigarettes smoked. This allows for a sense of accomplishment as well as providing a clinical benefit. I provided  him  with smoking cessation  information  with contact information for community resources, classes, free nicotine replacement therapy, and access to mobile apps, text messaging, and on-line smoking cessation help. I have also provided  him  the office contact information in  the event he needs to contact me, or the screening staff. We discussed the time and location of the scan, and that either Doroteo Glassman RN, Joella Prince, RN  or I will call / send a letter with the results within 24-72 hours of receiving them. The patient verbalized understanding of all of  the above and had no further questions upon leaving the office. They have my contact information in the event they have any further questions. ? ?I spent 3-4  minutes counseling on smoking cessation and the health risks of continued tobacco abuse. ?He is motivated to quit, and wants to do this with his fiance. I have him the 1-800 QUIT NOW number for free nicotine replacement therapy.  ? ?I explained to the patient that there has been a high incidence of coronary artery disease noted on these exams. I explained that this is a non-gated exam therefore degree or severity cannot be determined. This patient is not on statin therapy. I have asked the patient to follow-up with their PCP regarding any incidental finding of coronary artery disease and management with diet or medication as their PCP  feels is clinically indicated. The patient verbalized understanding of the above and had no further questions upon completion of the visit. ? ?  ? ? ?Magdalen Spatz, NP ?11/05/2021 ? ? ? ? ? ? ?

## 2021-11-05 NOTE — Patient Instructions (Signed)
Thank you for participating in the [facility] Screening [facility]. °It was our pleasure to meet you today. °We will call you with the results of your scan within the next few days. °Your scan will be assigned a Lung RADS category score by the physicians reading the scans.  °This Lung RADS score determines follow up scanning.  °See below for description of categories, and follow up screening recommendations. °We will be in touch to schedule your follow up screening annually or based on recommendations of our providers. °We will fax a copy of your scan results to your Primary Care Physician, or the physician who referred you to the program, to ensure they have the results. °Please call the office if you have any questions or concerns regarding your scanning experience or results.  °Our office number is [phone]. °Please speak with Denise Phelps, RN. She is our Lung Cancer Screening RN. °If she is unavailable when you call, please have the office staff send her a message. She will return your call at her earliest convenience. °Remember, if your scan is normal, we will scan you annually as long as you continue to meet the criteria for the program. (Age 55-77, Current smoker or smoker who has quit within the last 15 years). °If you are a smoker, remember, quitting is the single most powerful action that you can take to decrease your risk of lung cancer and other pulmonary, breathing related problems. °We know quitting is hard, and we are here to help.  °Please let us know if there is anything we can do to help you meet your goal of quitting. °If you are a former smoker, congratulations. We are proud of you! Remain smoke free! °Remember you can refer friends or family members through the number above.  °We will screen them to make sure they meet criteria for the program. °Thank you for helping us take better care of you by participating in Lung Screening. ° °You can receive free nicotine replacement therapy  ( patches, gum or mints) by calling 1-800-QUIT NOW. Please call so we can get you on the path to becoming  a non-smoker. I know it is hard, but you can do this! ° °Lung RADS Categories: ° °Lung RADS 1: no nodules or definitely non-concerning nodules.  °Recommendation is for a repeat annual scan in 12 months. ° °Lung RADS 2:  nodules that are non-concerning in appearance and behavior with a very low likelihood of becoming an active cancer. °Recommendation is for a repeat annual scan in 12 months. ° °Lung RADS 3: nodules that are probably non-concerning , includes nodules with a low likelihood of becoming an active cancer.  Recommendation is for a 6-month repeat screening scan. Often noted after an upper respiratory illness. We will be in touch to make sure you have no questions, and to schedule your 6-month scan. ° °Lung RADS 4 A: nodules with concerning findings, recommendation is most often for a follow up scan in 3 months or additional testing based on our provider's assessment of the scan. We will be in touch to make sure you have no questions and to schedule the recommended 3 month follow up scan. ° °Lung RADS 4 B:  indicates findings that are concerning. We will be in touch with you to schedule additional diagnostic testing based on our provider's  assessment of the scan. ° °Hypnosis for smoking cessation  °[employer] °[phone] ° °Acupuncture for smoking cessation  °[facility] °[phone]  °

## 2021-11-06 ENCOUNTER — Inpatient Hospital Stay: Admission: RE | Admit: 2021-11-06 | Payer: 59 | Source: Ambulatory Visit

## 2021-12-08 ENCOUNTER — Inpatient Hospital Stay: Admission: RE | Admit: 2021-12-08 | Payer: 59 | Source: Ambulatory Visit

## 2022-03-02 ENCOUNTER — Ambulatory Visit: Payer: 59 | Admitting: Family Medicine

## 2022-03-10 ENCOUNTER — Encounter: Payer: Self-pay | Admitting: Family Medicine

## 2022-03-10 ENCOUNTER — Ambulatory Visit (INDEPENDENT_AMBULATORY_CARE_PROVIDER_SITE_OTHER): Payer: 59 | Admitting: Family Medicine

## 2022-03-10 VITALS — BP 118/78 | HR 102 | Temp 98.6°F | Wt 195.4 lb

## 2022-03-10 DIAGNOSIS — L309 Dermatitis, unspecified: Secondary | ICD-10-CM | POA: Insufficient documentation

## 2022-03-10 DIAGNOSIS — F411 Generalized anxiety disorder: Secondary | ICD-10-CM

## 2022-03-10 MED ORDER — TRIAMCINOLONE ACETONIDE 0.1 % EX CREA: 1.0000 | TOPICAL_CREAM | Freq: Two times a day (BID) | CUTANEOUS | 5 refills | Status: AC

## 2022-03-10 MED ORDER — ALPRAZOLAM 1 MG PO TABS: 1.0000 mg | ORAL_TABLET | Freq: Two times a day (BID) | ORAL | 5 refills | Status: DC | PRN

## 2022-03-10 NOTE — Progress Notes (Signed)
   Subjective:    Patient ID: Dylan Houston, male    DOB: 1966/01/12, 56 y.o.   MRN: [id number]  HPI Here for several issues. First he has been under a lot of stress recently, and he aks for a refill on Xanax. A family member is currently in jail for a very unfortunate event, and he has been quite upset about this. Also he describes an itchy rash that appeared on his chest about 5 weeks ago. The rash then spread to both arms. He has tried OTC lotions with no relief.    Review of Systems  Constitutional: Negative.   Respiratory: Negative.    Skin:  Positive for rash.  Psychiatric/Behavioral:  Negative for agitation, behavioral problems, confusion, decreased concentration, dysphoric mood, hallucinations and sleep disturbance. The patient is nervous/anxious.        Objective:   Physical Exam Constitutional:      Appearance: Normal appearance.  Cardiovascular:     Rate and Rhythm: Normal rate and regular rhythm.     Pulses: Normal pulses.     Heart sounds: Normal heart sounds.  Pulmonary:     Effort: Pulmonary effort is normal.     Breath sounds: Normal breath sounds.  Skin:    Comments: Widespread areas of red, scaly skin on trunk and both arms   Neurological:     Mental Status: He is alert.  Psychiatric:        Mood and Affect: Mood normal.        Behavior: Behavior normal.        Thought Content: Thought content normal.           Assessment & Plan:  He has eczema and we will treat this with Triamcinolone cream as needed. He has also been dealing with a lot of anxiety. We will refill the Xanax to use BID as needed.  Gershon Crane, MD

## 2022-04-21 ENCOUNTER — Telehealth: Payer: Self-pay | Admitting: Family Medicine

## 2022-04-21 NOTE — Telephone Encounter (Signed)
Pt requesting ALPRAZolam (XANAX) 1 MG tablet be adjusted to 3x a day instead of 2x day

## 2022-04-21 NOTE — Telephone Encounter (Signed)
Spoke with patient, he is requesting medication increase due to the amount of increased stress with family, work, etc that he has been under recently.  Please advise

## 2022-04-28 MED ORDER — ALPRAZOLAM 1 MG PO TABS: 1.0000 mg | ORAL_TABLET | Freq: Three times a day (TID) | ORAL | 5 refills | Status: DC | PRN

## 2022-04-28 NOTE — Addendum Note (Signed)
Addended by: Gershon Crane A on: 04/28/2022 11:58 AM   Modules accepted: Orders

## 2022-04-28 NOTE — Telephone Encounter (Signed)
Done

## 2022-06-12 ENCOUNTER — Other Ambulatory Visit: Payer: Self-pay | Admitting: Pulmonary Disease

## 2022-09-19 ENCOUNTER — Other Ambulatory Visit: Payer: Self-pay | Admitting: Family Medicine

## 2022-09-21 NOTE — Telephone Encounter (Signed)
Patient calling to check on progress of this refill 

## 2022-09-21 NOTE — Telephone Encounter (Signed)
Pt LOV was done on 03/10/22 Last refill was done on 04/28/22 Please advise

## 2023-05-12 ENCOUNTER — Other Ambulatory Visit: Payer: Self-pay | Admitting: Family Medicine

## 2023-05-12 NOTE — Telephone Encounter (Signed)
Pt LOV  was on 03/10/22 Last refill was done on 09/21/22 Please advise

## 2023-08-25 DIAGNOSIS — Z419 Encounter for procedure for purposes other than remedying health state, unspecified: Secondary | ICD-10-CM | POA: Diagnosis not present

## 2023-09-25 DIAGNOSIS — Z419 Encounter for procedure for purposes other than remedying health state, unspecified: Secondary | ICD-10-CM | POA: Diagnosis not present

## 2023-10-23 DIAGNOSIS — Z419 Encounter for procedure for purposes other than remedying health state, unspecified: Secondary | ICD-10-CM | POA: Diagnosis not present

## 2023-11-16 ENCOUNTER — Other Ambulatory Visit: Payer: Self-pay | Admitting: Family Medicine

## 2023-11-16 NOTE — Telephone Encounter (Signed)
 Attempted to contact pt to schedule appointment with Dr Clent Ridges for a CPE/medication refill, pt hang up the phone.

## 2023-11-22 ENCOUNTER — Ambulatory Visit: Admitting: Family Medicine

## 2023-12-02 ENCOUNTER — Telehealth: Payer: Self-pay | Admitting: Family Medicine

## 2023-12-02 NOTE — Telephone Encounter (Signed)
 Pt was sch in 15 min slot for cpe and visit type was office visit I have called pt twice and left message for pt to rsc also sent mychart message

## 2023-12-04 DIAGNOSIS — Z419 Encounter for procedure for purposes other than remedying health state, unspecified: Secondary | ICD-10-CM | POA: Diagnosis not present

## 2023-12-06 ENCOUNTER — Ambulatory Visit: Admitting: Family Medicine

## 2023-12-20 ENCOUNTER — Encounter: Admitting: Family Medicine

## 2023-12-20 ENCOUNTER — Ambulatory Visit: Admitting: Family Medicine

## 2024-01-03 DIAGNOSIS — Z419 Encounter for procedure for purposes other than remedying health state, unspecified: Secondary | ICD-10-CM | POA: Diagnosis not present

## 2024-01-11 ENCOUNTER — Ambulatory Visit: Admitting: Family Medicine

## 2024-02-03 DIAGNOSIS — Z419 Encounter for procedure for purposes other than remedying health state, unspecified: Secondary | ICD-10-CM | POA: Diagnosis not present

## 2024-02-14 ENCOUNTER — Ambulatory Visit: Admitting: Family Medicine

## 2024-02-28 ENCOUNTER — Ambulatory Visit (INDEPENDENT_AMBULATORY_CARE_PROVIDER_SITE_OTHER): Admitting: Family Medicine

## 2024-02-28 ENCOUNTER — Encounter: Payer: Self-pay | Admitting: Family Medicine

## 2024-02-28 VITALS — BP 110/66 | HR 89 | Temp 98.0°F | Ht 68.31 in | Wt 182.6 lb

## 2024-02-28 DIAGNOSIS — I10 Essential (primary) hypertension: Secondary | ICD-10-CM

## 2024-02-28 DIAGNOSIS — J439 Emphysema, unspecified: Secondary | ICD-10-CM | POA: Diagnosis not present

## 2024-02-28 DIAGNOSIS — F411 Generalized anxiety disorder: Secondary | ICD-10-CM | POA: Diagnosis not present

## 2024-02-28 MED ORDER — ALPRAZOLAM 1 MG PO TABS: 1.0000 mg | ORAL_TABLET | Freq: Two times a day (BID) | ORAL | 5 refills | Status: DC | PRN

## 2024-02-28 MED ORDER — ALBUTEROL SULFATE HFA 108 (90 BASE) MCG/ACT IN AERS: 2.0000 | INHALATION_SPRAY | RESPIRATORY_TRACT | 11 refills | Status: AC | PRN

## 2024-02-28 NOTE — Progress Notes (Signed)
   Subjective:    Patient ID: Dylan Houston, male    DOB: 08/07/1966, 59 y.o.   MRN: [id number]  HPI Here for medication refills. He has been doing well except for some mild asthma flares with the recent hot weather.    Review of Systems  Constitutional: Negative.   Respiratory: Negative.    Cardiovascular: Negative.        Objective:   Physical Exam Constitutional:      Appearance: Normal appearance.  Cardiovascular:     Rate and Rhythm: Normal rate and regular rhythm.     Pulses: Normal pulses.     Heart sounds: Normal heart sounds.  Pulmonary:     Effort: Pulmonary effort is normal.     Breath sounds: Normal breath sounds.  Neurological:     Mental Status: He is alert.           Assessment & Plan:  His HTN is stable. His asthma is stable, and we will refill the albuterol  inhaler. His anxiety is stable, so we will refill  the Xanax . Garnette Olmsted, MD

## 2024-03-04 DIAGNOSIS — Z419 Encounter for procedure for purposes other than remedying health state, unspecified: Secondary | ICD-10-CM | POA: Diagnosis not present

## 2024-04-04 DIAGNOSIS — Z419 Encounter for procedure for purposes other than remedying health state, unspecified: Secondary | ICD-10-CM | POA: Diagnosis not present

## 2024-05-05 DIAGNOSIS — Z419 Encounter for procedure for purposes other than remedying health state, unspecified: Secondary | ICD-10-CM | POA: Diagnosis not present

## 2024-07-05 DIAGNOSIS — Z419 Encounter for procedure for purposes other than remedying health state, unspecified: Secondary | ICD-10-CM | POA: Diagnosis not present

## 2024-08-08 ENCOUNTER — Encounter: Payer: Self-pay | Admitting: Family Medicine

## 2024-08-08 ENCOUNTER — Ambulatory Visit: Payer: Self-pay

## 2024-08-08 ENCOUNTER — Ambulatory Visit (INDEPENDENT_AMBULATORY_CARE_PROVIDER_SITE_OTHER): Admitting: Family Medicine

## 2024-08-08 VITALS — BP 124/64 | HR 107 | Temp 97.7°F | Wt 185.1 lb

## 2024-08-08 DIAGNOSIS — H109 Unspecified conjunctivitis: Secondary | ICD-10-CM

## 2024-08-08 DIAGNOSIS — J019 Acute sinusitis, unspecified: Secondary | ICD-10-CM | POA: Diagnosis not present

## 2024-08-08 MED ORDER — AMOXICILLIN-POT CLAVULANATE 875-125 MG PO TABS: 1.0000 | ORAL_TABLET | Freq: Two times a day (BID) | ORAL | 0 refills | Status: AC

## 2024-08-08 MED ORDER — TOBRAMYCIN 0.3 % OP SOLN: 2.0000 [drp] | OPHTHALMIC | 0 refills | Status: DC

## 2024-08-08 NOTE — Telephone Encounter (Signed)
 FYI Only or Action Required?: FYI only for provider: appointment scheduled on 08/08/24.  Patient was last seen in primary care on 02/28/2024 by Dylan Houston LABOR, MD.  Called Nurse Triage reporting Eye Problem.  Symptoms began several days ago.  Interventions attempted: OTC medications: allergy drops.  Symptoms are: gradually worsening.  Triage Disposition: See HCP Within 4 Hours (Or PCP Triage)  Patient/caregiver understands and will follow disposition?: Yes Reason for Disposition  MODERATE eye pain (e.g., interferes with normal activities)  Answer Assessment - Initial Assessment Questions Left eye irritated and painful with clear Houston.  Dylan Houston: Is the Houston in one or both eyes? What color is it? How much is there? When did the Houston start?      Clear  2. REDNESS OF SCLERA: Is there redness in the white of the eye? If Yes, ask: Is it in one or both eyes? When did the redness start?     Red  3. EYELIDS: Are the eyelids red or swollen? If Yes, ask: How much?      Mild  4. VISION: Do you have blurred vision?     Yes, hazed on that side  5. PAIN: Is there any pain? If Yes, ask: How bad is the pain? (Scale 0-10; or none, mild, moderate, severe)     Moderate  6. CONTACT LENS: Do you wear contacts?     Denies  Protocols used: Eye - Pus or Houston-A-AH Copied from CRM #[id number]. Topic: Clinical - Red Word Triage >> Aug 08, 2024 11:13 AM Dylan Houston wrote: Red Word that prompted transfer to Nurse Triage:  Blood shot left eye, draining, painful. Pt would like to see PCP

## 2024-08-08 NOTE — Progress Notes (Signed)
 Established Patient Office Visit  Subjective   Patient ID: Dylan Houston, male    DOB: [birth year]  Age: 58 y.o. MRN: [id number]  Chief Complaint  Patient presents with   Eye Pain    HPI   Dylan Houston is seen today with about 3-day history of some left eye redness and drainage.  Denies any eye injury.  No blurred vision.  He had a lot of matting and crusted drainage especially worse early in the morning.  He also relates several week history of pansinusitis symptoms with some sinus congestion and fullness and colored nasal discharge.  He feels like he is having sinusitis around the left eye.  No orbital or preorbital cellulitis changes.  No documented fever.  Occasional headaches.  Past Medical History:  Diagnosis Date   Chest pain    Coronary artery disease    Hypercholesteremia    Hypertension    Panic attack    Past Surgical History:  Procedure Laterality Date   CARDIAC CATHETERIZATION     KNEE ARTHROSCOPY W/ MENISCAL REPAIR Left 1995   per [facility]    ROTATOR CUFF REPAIR Left 1998   per [facility]     reports that he has been smoking cigarettes. He started smoking about 47 years ago. He has a 48 pack-year smoking history. He has never used smokeless tobacco. He reports that he does not drink alcohol and does not use drugs. family history includes Cancer in his mother and unknown relative; Emphysema in his unknown relative; Heart disease in his father; Lung cancer (age of onset: 48) in his father. Allergies[1]  Review of Systems  Constitutional:  Negative for chills and fever.  HENT:  Positive for congestion and sinus pain.   Eyes:  Positive for discharge and redness.  Respiratory:  Negative for cough.       Objective:     BP 124/64   Pulse (!) 107   Temp 97.7 F (36.5 C) (Oral)   Wt 185 lb 1.6 oz (84 kg)   SpO2 95%   BMI 27.89 kg/m  BP Readings from Last 3 Encounters:  08/08/24 124/64  02/28/24 110/66  03/10/22 118/78   Wt Readings from Last 3  Encounters:  08/08/24 185 lb 1.6 oz (84 kg)  02/28/24 182 lb 9.6 oz (82.8 kg)  03/10/22 195 lb 6 oz (88.6 kg)      Physical Exam Vitals reviewed.  Constitutional:      General: He is not in acute distress.    Appearance: He is not ill-appearing.  HENT:     Head:     Comments: Tender over left frontal sinus region    Right Ear: Tympanic membrane normal.     Left Ear: Tympanic membrane normal.  Eyes:     Comments: He has some obvious erythema to the left conjunctivocompared to the right.  Little bit of mucoid discharge from the left eye.  Pupils equal round reactive to light.  No periorbital rash.  No periorbital cellulitis changes.  Cardiovascular:     Rate and Rhythm: Normal rate.  Musculoskeletal:     Cervical back: Neck supple.  Neurological:     Mental Status: He is alert.      No results found for any visits on 08/08/24.    The ASCVD Risk score (Arnett DK, et al., 2019) failed to calculate for the following reasons:   Cannot find a previous HDL lab   Cannot find a previous total cholesterol lab   * -  Cholesterol units were assumed    Assessment & Plan:   Patient is seen with a few day history of left eye bacterial conjunctivitis symptoms and probable pansinusitis for the past few weeks.  Recommend Tobrex  eyedrops 2 drops left eye every 4 hours while awake.  Also start Augmentin  875 mg twice daily for 10 days.  Follow-up for any persistent or worsening symptoms.  Dylan Scarlet, MD     [1]  Allergies Allergen Reactions   Doxycycline Hyclate Diarrhea and Nausea And Vomiting   Benadryl [Diphenhydramine Hcl] Anxiety

## 2024-08-15 ENCOUNTER — Other Ambulatory Visit: Payer: Self-pay | Admitting: Family Medicine

## 2024-08-15 NOTE — Telephone Encounter (Signed)
 Left a message for the patient to return my call.

## 2024-09-03 ENCOUNTER — Other Ambulatory Visit: Payer: Self-pay | Admitting: Family Medicine

## 2024-09-06 ENCOUNTER — Other Ambulatory Visit: Payer: Self-pay | Admitting: Family Medicine

## 2024-09-08 ENCOUNTER — Other Ambulatory Visit: Payer: Self-pay | Admitting: Family Medicine

## 2024-09-20 ENCOUNTER — Ambulatory Visit: Payer: Self-pay
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
## 2024-09-20 NOTE — Telephone Encounter (Signed)
 Noted.

## 2024-09-22 ENCOUNTER — Ambulatory Visit: Admitting: Family Medicine

## 2024-09-22 ENCOUNTER — Encounter: Payer: Self-pay | Admitting: Family Medicine

## 2024-09-22 VITALS — BP 110/70 | HR 108 | Temp 97.6°F | Wt 188.2 lb

## 2024-09-22 DIAGNOSIS — H1033 Unspecified acute conjunctivitis, bilateral: Secondary | ICD-10-CM | POA: Diagnosis not present

## 2024-09-22 DIAGNOSIS — J309 Allergic rhinitis, unspecified: Secondary | ICD-10-CM | POA: Diagnosis not present

## 2024-09-22 DIAGNOSIS — M545 Low back pain, unspecified: Secondary | ICD-10-CM

## 2024-09-22 DIAGNOSIS — Z23 Encounter for immunization: Secondary | ICD-10-CM

## 2024-09-22 MED ORDER — METHOCARBAMOL 500 MG PO TABS: 500.0000 mg | ORAL_TABLET | Freq: Three times a day (TID) | ORAL | 0 refills | Status: AC | PRN

## 2024-09-22 MED ORDER — PREDNISONE 20 MG PO TABS: ORAL_TABLET | ORAL | 0 refills | Status: AC

## 2024-09-22 MED ORDER — TOBRAMYCIN 0.3 % OP SOLN: 2.0000 [drp] | OPHTHALMIC | 0 refills | Status: AC
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
## 2024-09-22 NOTE — Patient Instructions (Signed)
Consider over the counter antihistamine such as Allegra or Zyrtec.

## 2024-09-24 ENCOUNTER — Other Ambulatory Visit: Payer: Self-pay | Admitting: Family Medicine

## 2024-09-26 ENCOUNTER — Telehealth: Payer: Self-pay
